# Patient Record
Sex: Female | Born: 1985
Health system: Southern US, Community
[De-identification: ages and names within clinical notes are randomized; demographics above are authoritative.]

## PROBLEM LIST (undated history)

## (undated) DIAGNOSIS — I10 Essential (primary) hypertension: Secondary | ICD-10-CM

## (undated) DIAGNOSIS — J45909 Unspecified asthma, uncomplicated: Secondary | ICD-10-CM

## (undated) HISTORY — PX: ABDOMINAL SURGERY: SHX537

---

## 2006-03-13 ENCOUNTER — Inpatient Hospital Stay (HOSPITAL_COMMUNITY): Admission: AD | Admit: 2006-03-13 | Discharge: 2006-03-13 | Payer: Self-pay | Admitting: Gynecology

## 2006-04-01 ENCOUNTER — Inpatient Hospital Stay (HOSPITAL_COMMUNITY): Admission: AD | Admit: 2006-04-01 | Discharge: 2006-04-01 | Payer: Self-pay | Admitting: Obstetrics and Gynecology

## 2006-05-28 ENCOUNTER — Inpatient Hospital Stay (HOSPITAL_COMMUNITY): Admission: AD | Admit: 2006-05-28 | Discharge: 2006-05-28 | Payer: Self-pay | Admitting: Obstetrics and Gynecology

## 2006-05-31 ENCOUNTER — Inpatient Hospital Stay (HOSPITAL_COMMUNITY): Admission: AD | Admit: 2006-05-31 | Discharge: 2006-06-02 | Payer: Self-pay | Admitting: Obstetrics and Gynecology

## 2006-07-18 ENCOUNTER — Other Ambulatory Visit: Admission: RE | Admit: 2006-07-18 | Discharge: 2006-07-18 | Payer: Self-pay | Admitting: Obstetrics and Gynecology

## 2007-01-02 ENCOUNTER — Ambulatory Visit: Payer: Self-pay | Admitting: Family Medicine

## 2007-01-02 ENCOUNTER — Inpatient Hospital Stay (HOSPITAL_COMMUNITY): Admission: AD | Admit: 2007-01-02 | Discharge: 2007-01-02 | Payer: Self-pay | Admitting: Obstetrics & Gynecology

## 2007-03-07 ENCOUNTER — Inpatient Hospital Stay (HOSPITAL_COMMUNITY): Admission: AD | Admit: 2007-03-07 | Discharge: 2007-03-07 | Payer: Self-pay | Admitting: Gynecology

## 2007-03-07 ENCOUNTER — Ambulatory Visit: Payer: Self-pay | Admitting: *Deleted

## 2007-03-29 ENCOUNTER — Inpatient Hospital Stay (HOSPITAL_COMMUNITY): Admission: AD | Admit: 2007-03-29 | Discharge: 2007-03-29 | Payer: Self-pay | Admitting: Obstetrics and Gynecology

## 2007-03-29 ENCOUNTER — Ambulatory Visit: Payer: Self-pay | Admitting: *Deleted

## 2007-05-06 ENCOUNTER — Ambulatory Visit: Payer: Self-pay | Admitting: *Deleted

## 2007-05-06 ENCOUNTER — Inpatient Hospital Stay (HOSPITAL_COMMUNITY): Admission: AD | Admit: 2007-05-06 | Discharge: 2007-05-08 | Payer: Self-pay | Admitting: Family Medicine

## 2008-12-12 ENCOUNTER — Emergency Department (HOSPITAL_COMMUNITY): Admission: EM | Admit: 2008-12-12 | Discharge: 2008-12-12 | Payer: Self-pay | Admitting: Emergency Medicine

## 2009-05-06 ENCOUNTER — Inpatient Hospital Stay (HOSPITAL_COMMUNITY): Admission: AD | Admit: 2009-05-06 | Discharge: 2009-05-06 | Payer: Self-pay | Admitting: Obstetrics & Gynecology

## 2009-05-13 ENCOUNTER — Ambulatory Visit (HOSPITAL_COMMUNITY): Admission: RE | Admit: 2009-05-13 | Discharge: 2009-05-13 | Payer: Self-pay | Admitting: Obstetrics & Gynecology

## 2010-09-04 ENCOUNTER — Encounter: Payer: Self-pay | Admitting: Obstetrics and Gynecology

## 2010-11-19 LAB — GC/CHLAMYDIA PROBE AMP, GENITAL: GC Probe Amp, Genital: NEGATIVE

## 2010-11-19 LAB — URINALYSIS, ROUTINE W REFLEX MICROSCOPIC
Glucose, UA: NEGATIVE mg/dL
Ketones, ur: >80 mg/dL — AB
Protein, ur: NEGATIVE mg/dL
Urobilinogen, UA: 4 mg/dL — ABNORMAL HIGH (ref 0.0–1.0)

## 2010-11-19 LAB — WET PREP, GENITAL: Trich, Wet Prep: NONE SEEN

## 2010-12-31 NOTE — Discharge Summary (Signed)
Sheri Rodriguez, HEDBERG            ACCOUNT NO.:  [id number]   MEDICAL RECORD NO.:  [id number]          PATIENT TYPE:  INP   LOCATION:  9143                          FACILITY:  [facility]   PHYSICIAN:  James A. Ashley Royalty, M.D.DATE OF BIRTH:  09-Jan-1986   DATE OF ADMISSION:  05/31/2006  DATE OF DISCHARGE:  06/02/2006                               DISCHARGE SUMMARY   DISCHARGE DIAGNOSES:  1. Intrauterine pregnancy at term, delivered.  2. Term birth with living child vertex.   SPECIAL PROCEDURES:  OB delivery.   CONSULTATIONS:  None.   DISCHARGE MEDICATIONS:  Darvocet.   HISTORY AND PHYSICAL:  This is a 25 year old para 1-0-0-1 at [redacted] weeks  gestation who presented in labor.  Digital cervical examination revealed  6 cm dilatation, completely effaced, 0 station.  For the remainder of  the history and physical, please see chart.   HOSPITAL COURSE:  The patient was admitted to [facility]  [place].  Admission laboratory studies were drawn.  She went on to  labor and deliver on May 31, 2006.  The infant was a 6 pound 12  ounce female.  Apgar's 9 at one minute and 9 at five minutes.  Sent to the  newborn nursery.  Delivery was accomplished by Dr. Bing Neighbors. [place].  The patient's postpartum course was benign.  She was discharged on the  second postpartum day, afebrile and in satisfactory condition.   DISPOSITION:  The patient is to return to [facility] in four to six weeks for postpartum evaluation.      James A. Ashley Royalty, M.D.  Electronically Signed     JAM/MEDQ  D:  06/19/2006  T:  06/19/2006  Job:  [id number]

## 2011-05-26 LAB — CBC
HCT: 37.3
MCHC: 33.9
MCV: 88.2
Platelets: 193
WBC: 7.2

## 2011-05-26 LAB — RAPID URINE DRUG SCREEN, HOSP PERFORMED
Benzodiazepines: NOT DETECTED
Cocaine: NOT DETECTED
Tetrahydrocannabinol: NOT DETECTED

## 2011-05-26 LAB — URINALYSIS, DIPSTICK ONLY
Bilirubin Urine: NEGATIVE
Glucose, UA: NEGATIVE
Ketones, ur: 15 — AB
Protein, ur: NEGATIVE
pH: 7

## 2011-05-26 LAB — CHLAMYDIA PROBE AMPLIFICATION, URINE: Chlamydia, Swab/Urine, PCR: NEGATIVE

## 2011-05-30 LAB — CBC
HCT: 34.6 — ABNORMAL LOW
MCV: 89.2
Platelets: 210
RDW: 12.6
WBC: 6.4

## 2011-05-30 LAB — URINALYSIS, ROUTINE W REFLEX MICROSCOPIC
Bilirubin Urine: NEGATIVE
Ketones, ur: NEGATIVE
Ketones, ur: NEGATIVE
Nitrite: NEGATIVE
Nitrite: NEGATIVE
Protein, ur: NEGATIVE
Protein, ur: NEGATIVE
Urobilinogen, UA: 0.2
pH: 7

## 2011-05-30 LAB — WET PREP, GENITAL: Yeast Wet Prep HPF POC: NONE SEEN

## 2011-05-30 LAB — URINE CULTURE: Colony Count: 40000

## 2011-05-30 LAB — SICKLE CELL SCREEN: Sickle Cell Screen: NEGATIVE

## 2011-05-30 LAB — DIFFERENTIAL
Eosinophils Absolute: 0.1
Eosinophils Relative: 2
Lymphs Abs: 2

## 2011-05-30 LAB — HEPATITIS B SURFACE ANTIGEN: Hepatitis B Surface Ag: NEGATIVE

## 2011-05-30 LAB — TYPE AND SCREEN

## 2017-09-01 ENCOUNTER — Emergency Department (HOSPITAL_BASED_OUTPATIENT_CLINIC_OR_DEPARTMENT_OTHER)
Admission: EM | Admit: 2017-09-01 | Discharge: 2017-09-01 | Disposition: A | Discharge status: Home / Self Care | Payer: Medicaid Other | Attending: Emergency Medicine | Admitting: Emergency Medicine

## 2017-09-01 ENCOUNTER — Encounter (HOSPITAL_BASED_OUTPATIENT_CLINIC_OR_DEPARTMENT_OTHER): Payer: Self-pay | Admitting: Emergency Medicine

## 2017-09-01 ENCOUNTER — Other Ambulatory Visit: Payer: Self-pay

## 2017-09-01 DIAGNOSIS — I10 Essential (primary) hypertension: Secondary | ICD-10-CM | POA: Insufficient documentation

## 2017-09-01 DIAGNOSIS — J020 Streptococcal pharyngitis: Secondary | ICD-10-CM | POA: Diagnosis not present

## 2017-09-01 DIAGNOSIS — R070 Pain in throat: Secondary | ICD-10-CM | POA: Diagnosis present

## 2017-09-01 DIAGNOSIS — Z23 Encounter for immunization: Secondary | ICD-10-CM | POA: Diagnosis not present

## 2017-09-01 HISTORY — DX: Essential (primary) hypertension: I10

## 2017-09-01 LAB — RAPID STREP SCREEN (MED CTR MEBANE ONLY): STREPTOCOCCUS, GROUP A SCREEN (DIRECT): POSITIVE — AB

## 2017-09-01 MED ORDER — ACETAMINOPHEN 500 MG PO TABS
1000.0000 mg | ORAL_TABLET | Freq: Once | ORAL | Status: AC
  Administered 2017-09-01: 1000 mg via ORAL
  Filled 2017-09-01: qty 2

## 2017-09-01 MED ORDER — PENICILLIN G BENZATHINE 1200000 UNIT/2ML IM SUSP
1.2000 10*6.[IU] | Freq: Once | INTRAMUSCULAR | Status: AC
  Administered 2017-09-01: 1.2 10*6.[IU] via INTRAMUSCULAR
  Filled 2017-09-01: qty 2

## 2017-09-01 NOTE — ED Triage Notes (Addendum)
Pt c/o sinus pain, congestion, sore throat and body aches that started yesterday. Pt is 5 months pregnant

## 2017-09-01 NOTE — ED Provider Notes (Signed)
[facility] EMERGENCY DEPARTMENT Provider Note   CSN: [id number] Arrival date & time: 09/01/17  [id number]     History   Chief Complaint Chief Complaint  Patient presents with  . URI    HPI Sheri Rodriguez is a 32 y.o. female.  The history is provided by the patient.  Sore Throat  This is a new problem. The current episode started 2 days ago. The problem occurs constantly. The problem has not changed since onset.Pertinent negatives include no chest pain, no abdominal pain, no headaches and no shortness of breath. Nothing aggravates the symptoms. Nothing relieves the symptoms. She has tried nothing for the symptoms. The treatment provided no relief.    Past Medical History:  Diagnosis Date  . Hypertension     There are no active problems to display for this patient.   History reviewed. No pertinent surgical history.  OB History    Gravida Para Term Preterm AB Living   1             SAB TAB Ectopic Multiple Live Births                   Home Medications    Prior to Admission medications   Not on File    Family History No family history on file.  Social History Social History   Tobacco Use  . Smoking status: Never Smoker  . Smokeless tobacco: Never Used  Substance Use Topics  . Alcohol use: No    Frequency: Never  . Drug use: No     Allergies   Aspirin   Review of Systems Review of Systems  Constitutional: Positive for fever.  HENT: Positive for rhinorrhea and sore throat. Negative for drooling, trouble swallowing and voice change.   Respiratory: Negative for shortness of breath.   Cardiovascular: Negative for chest pain.  Gastrointestinal: Negative for abdominal pain.  Genitourinary: Negative for dysuria.  Neurological: Negative for headaches.  All other systems reviewed and are negative.    Physical Exam Updated Vital Signs BP (!) 146/85 (BP Location: Left Arm)   Pulse (!) 104   Temp 100 F (37.8 C) (Oral)   Resp 18   Ht  5\' 7"  (1.702 m)   Wt 134.7 kg (297 lb)   LMP  (Exact Date)   SpO2 100%   BMI 46.52 kg/m   Physical Exam  Constitutional: She is oriented to person, place, and time. She appears well-developed and well-nourished. No distress.  HENT:  Head: Normocephalic and atraumatic.  Mouth/Throat: Oropharynx is clear and moist. No oropharyngeal exudate.  Intact phonation  Eyes: Conjunctivae are normal. Pupils are equal, round, and reactive to light.  Neck: Normal range of motion. Neck supple.  Cardiovascular: Normal rate, regular rhythm, normal heart sounds and intact distal pulses.  Pulmonary/Chest: Effort normal and breath sounds normal. No stridor. She has no wheezes. She has no rales.  Abdominal: Soft. Bowel sounds are normal. She exhibits no mass. There is no tenderness. There is no guarding.  gravid  Musculoskeletal: Normal range of motion.  Neurological: She is alert and oriented to person, place, and time.  Skin: Skin is warm and dry. Capillary refill takes less than 2 seconds.  Psychiatric: She has a normal mood and affect.     ED Treatments / Results  Labs (all labs ordered are listed, but only abnormal results are displayed) Labs Reviewed  RAPID STREP SCREEN (NOT AT [facility]) - Abnormal; Notable for the following components:  Result Value   Streptococcus, Group A Screen (Direct) POSITIVE (*)    All other components within normal limits    Radiology No results found.  Procedures Procedures (including critical care time)  Medications Ordered in ED Medications  acetaminophen (TYLENOL) tablet 1,000 mg (1,000 mg Oral Given 09/01/17 0350)  penicillin g benzathine (BICILLIN LA) 1200000 UNIT/2ML injection 1.2 Million Units (1.2 Million Units Intramuscular Given 09/01/17 0405)     Final Clinical Impressions(s) / ED Diagnoses   call you OB to discuss ongoing care  Return for worsening pain, vomiting blood inability to pass urine,  fevers > 100.4 unrelieved by medication, shortness  of breath, intractable vomiting, or diarrhea, abdominal pain, Inability to tolerate liquids or food, cough, altered mental status or any concerns. No signs of systemic illness or infection. The patient is nontoxic-appearing on exam and vital signs are within normal limits.    I have reviewed the triage vital signs and the nursing notes. Pertinent labs &imaging results that were available during my care of the patient were reviewed by me and considered in my medical decision making (see chart for details).  After history, exam, and medical workup I feel the patient has been appropriately medically screened and is safe for discharge home. Pertinent diagnoses were discussed with the patient. Patient was given return precautions.     Keena Dinse, MD 09/01/17 [phone]

## 2018-01-05 ENCOUNTER — Emergency Department (HOSPITAL_BASED_OUTPATIENT_CLINIC_OR_DEPARTMENT_OTHER)
Admission: EM | Admit: 2018-01-05 | Discharge: 2018-01-05 | Disposition: A | Discharge status: Home / Self Care | Payer: Medicaid Other | Attending: Emergency Medicine | Admitting: Emergency Medicine

## 2018-01-05 ENCOUNTER — Other Ambulatory Visit: Payer: Self-pay | Admitting: Emergency Medicine

## 2018-01-05 ENCOUNTER — Encounter (HOSPITAL_BASED_OUTPATIENT_CLINIC_OR_DEPARTMENT_OTHER): Payer: Self-pay

## 2018-01-05 ENCOUNTER — Other Ambulatory Visit: Payer: Self-pay

## 2018-01-05 DIAGNOSIS — J45909 Unspecified asthma, uncomplicated: Secondary | ICD-10-CM | POA: Diagnosis not present

## 2018-01-05 DIAGNOSIS — I10 Essential (primary) hypertension: Secondary | ICD-10-CM | POA: Diagnosis not present

## 2018-01-05 DIAGNOSIS — G971 Other reaction to spinal and lumbar puncture: Secondary | ICD-10-CM | POA: Diagnosis not present

## 2018-01-05 DIAGNOSIS — R51 Headache: Secondary | ICD-10-CM | POA: Diagnosis present

## 2018-01-05 HISTORY — DX: Unspecified asthma, uncomplicated: J45.909

## 2018-01-05 LAB — URINALYSIS, ROUTINE W REFLEX MICROSCOPIC
BILIRUBIN URINE: NEGATIVE
Glucose, UA: NEGATIVE mg/dL
Ketones, ur: NEGATIVE mg/dL
Leukocytes, UA: NEGATIVE
Nitrite: NEGATIVE
PROTEIN: NEGATIVE mg/dL
Specific Gravity, Urine: 1.01 (ref 1.005–1.030)
pH: 7.5 (ref 5.0–8.0)

## 2018-01-05 LAB — COMPREHENSIVE METABOLIC PANEL
ALK PHOS: 88 U/L (ref 38–126)
ALT: 20 U/L (ref 14–54)
AST: 19 U/L (ref 15–41)
Albumin: 3 g/dL — ABNORMAL LOW (ref 3.5–5.0)
Anion gap: 10 (ref 5–15)
BILIRUBIN TOTAL: 0.4 mg/dL (ref 0.3–1.2)
BUN: 7 mg/dL (ref 6–20)
CO2: 25 mmol/L (ref 22–32)
Calcium: 8.4 mg/dL — ABNORMAL LOW (ref 8.9–10.3)
Chloride: 102 mmol/L (ref 101–111)
Creatinine, Ser: 0.69 mg/dL (ref 0.44–1.00)
GLUCOSE: 99 mg/dL (ref 65–99)
Potassium: 3.4 mmol/L — ABNORMAL LOW (ref 3.5–5.1)
Sodium: 137 mmol/L (ref 135–145)
TOTAL PROTEIN: 6.5 g/dL (ref 6.5–8.1)

## 2018-01-05 LAB — CBC WITH DIFFERENTIAL/PLATELET
BASOS ABS: 0 10*3/uL (ref 0.0–0.1)
Basophils Relative: 0 %
Eosinophils Absolute: 0.4 10*3/uL (ref 0.0–0.7)
Eosinophils Relative: 5 %
HEMATOCRIT: 35.9 % — AB (ref 36.0–46.0)
Hemoglobin: 12.2 g/dL (ref 12.0–15.0)
LYMPHS ABS: 2 10*3/uL (ref 0.7–4.0)
LYMPHS PCT: 27 %
MCH: 28.3 pg (ref 26.0–34.0)
MCHC: 34 g/dL (ref 30.0–36.0)
MCV: 83.3 fL (ref 78.0–100.0)
MONO ABS: 0.5 10*3/uL (ref 0.1–1.0)
MONOS PCT: 7 %
NEUTROS ABS: 4.7 10*3/uL (ref 1.7–7.7)
Neutrophils Relative %: 61 %
Platelets: 277 10*3/uL (ref 150–400)
RBC: 4.31 MIL/uL (ref 3.87–5.11)
RDW: 13.2 % (ref 11.5–15.5)
WBC: 7.7 10*3/uL (ref 4.0–10.5)

## 2018-01-05 MED ORDER — PREDNISONE 20 MG PO TABS
20.00 | ORAL_TABLET | ORAL | Status: DC
Start: 2018-01-04 — End: 2018-01-05

## 2018-01-05 MED ORDER — KETOROLAC TROMETHAMINE 30 MG/ML IJ SOLN
30.0000 mg | Freq: Once | INTRAMUSCULAR | Status: AC
  Administered 2018-01-05: 30 mg via INTRAVENOUS
  Filled 2018-01-05: qty 1

## 2018-01-05 MED ORDER — GENERIC EXTERNAL MEDICATION
300.00 | Status: DC
Start: 2018-01-03 — End: 2018-01-05

## 2018-01-05 MED ORDER — GENERIC EXTERNAL MEDICATION
Status: DC
Start: ? — End: 2018-01-05

## 2018-01-05 MED ORDER — TETANUS-DIPHTH-ACELL PERTUSSIS 5-2.5-18.5 LF-MCG/0.5 IM SUSP
0.50 | INTRAMUSCULAR | Status: DC
Start: ? — End: 2018-01-05

## 2018-01-05 MED ORDER — BUTORPHANOL TARTRATE 1 MG/ML IJ SOLN
1.00 | INTRAMUSCULAR | Status: DC
Start: ? — End: 2018-01-05

## 2018-01-05 MED ORDER — SODIUM CHLORIDE 0.9 % IJ SOLN
5.00 | INTRAMUSCULAR | Status: DC
Start: ? — End: 2018-01-05

## 2018-01-05 MED ORDER — BISACODYL 10 MG RE SUPP
10.00 | RECTAL | Status: DC
Start: ? — End: 2018-01-05

## 2018-01-05 MED ORDER — MAGNESIUM HYDROXIDE 400 MG/5ML PO SUSP
30.00 | ORAL | Status: DC
Start: ? — End: 2018-01-05

## 2018-01-05 MED ORDER — SODIUM CHLORIDE 0.9 % IV BOLUS
1000.0000 mL | Freq: Once | INTRAVENOUS | Status: AC
  Administered 2018-01-05: 1000 mL via INTRAVENOUS

## 2018-01-05 MED ORDER — BENZOCAINE-MENTHOL 20-0.5 % EX AERO
1.00 | INHALATION_SPRAY | CUTANEOUS | Status: DC
Start: ? — End: 2018-01-05

## 2018-01-05 MED ORDER — GENERIC EXTERNAL MEDICATION
1.00 | Status: DC
Start: ? — End: 2018-01-05

## 2018-01-05 MED ORDER — GENERIC EXTERNAL MEDICATION
0.50 | Status: DC
Start: ? — End: 2018-01-05

## 2018-01-05 MED ORDER — ZOLPIDEM TARTRATE 5 MG PO TABS
10.00 | ORAL_TABLET | ORAL | Status: DC
Start: ? — End: 2018-01-05

## 2018-01-05 MED ORDER — MEASLES, MUMPS & RUBELLA VAC ~~LOC~~ INJ
0.50 | INJECTION | SUBCUTANEOUS | Status: DC
Start: ? — End: 2018-01-05

## 2018-01-05 MED ORDER — FLUTICASONE FUROATE-VILANTEROL 100-25 MCG/INH IN AEPB
1.00 | INHALATION_SPRAY | RESPIRATORY_TRACT | Status: DC
Start: 2018-01-04 — End: 2018-01-05

## 2018-01-05 MED ORDER — NALOXONE HCL 0.4 MG/ML IJ SOLN
0.10 | INTRAMUSCULAR | Status: DC
Start: ? — End: 2018-01-05

## 2018-01-05 MED ORDER — ALUMINUM-MAGNESIUM-SIMETHICONE 200-200-20 MG/5ML PO SUSP
30.00 | ORAL | Status: DC
Start: ? — End: 2018-01-05

## 2018-01-05 MED ORDER — ACETAMINOPHEN 325 MG PO TABS
650.00 | ORAL_TABLET | ORAL | Status: DC
Start: ? — End: 2018-01-05

## 2018-01-05 MED ORDER — METOCLOPRAMIDE HCL 5 MG/ML IJ SOLN
10.0000 mg | Freq: Once | INTRAMUSCULAR | Status: AC
  Administered 2018-01-05: 10 mg via INTRAVENOUS
  Filled 2018-01-05: qty 2

## 2018-01-05 MED ORDER — LACTATED RINGERS IV SOLN
INTRAVENOUS | Status: DC
Start: ? — End: 2018-01-05

## 2018-01-05 MED ORDER — ALBUTEROL SULFATE (2.5 MG/3ML) 0.083% IN NEBU
2.50 | INHALATION_SOLUTION | RESPIRATORY_TRACT | Status: DC
Start: ? — End: 2018-01-05

## 2018-01-05 MED ORDER — BUTORPHANOL TARTRATE 2 MG/ML IJ SOLN
2.00 | INTRAMUSCULAR | Status: DC
Start: ? — End: 2018-01-05

## 2018-01-05 MED ORDER — CYCLOBENZAPRINE HCL 10 MG PO TABS
10.00 | ORAL_TABLET | ORAL | Status: DC
Start: ? — End: 2018-01-05

## 2018-01-05 MED ORDER — EPHEDRINE SULFATE 50 MG/ML IJ SOLN
5.00 | INTRAMUSCULAR | Status: DC
Start: ? — End: 2018-01-05

## 2018-01-05 MED ORDER — HYDROCODONE-ACETAMINOPHEN 5-325 MG PO TABS
1.00 | ORAL_TABLET | ORAL | Status: DC
Start: ? — End: 2018-01-05

## 2018-01-05 MED ORDER — NALBUPHINE HCL 10 MG/ML IJ SOLN
2.50 | INTRAMUSCULAR | Status: DC
Start: ? — End: 2018-01-05

## 2018-01-05 MED ORDER — IRON PO
1.00 | ORAL | Status: DC
Start: 2018-01-03 — End: 2018-01-05

## 2018-01-05 NOTE — ED Notes (Signed)
ED Provider at bedside. 

## 2018-01-05 NOTE — ED Notes (Signed)
Provided Coke to pt for caffeine per v.o. EDP (Law).

## 2018-01-05 NOTE — ED Provider Notes (Signed)
[facility] EMERGENCY DEPARTMENT Provider Note   CSN: [id number] Arrival date & time: 01/05/18  1525     History   Chief Complaint Chief Complaint  Patient presents with  . Headache    HPI Sheri Rodriguez is a 32 y.o. female with history of hypertension and asthma who presents 2 days postpartum with headache, dizziness, tinnitus, neck and upper back pain after epidural analgesia 2 days ago.  Patient has been taking ibuprofen at home without relief.  Her headache is worse with sitting up and standing and moving her neck.  She has associated nausea and photophobia.  She denies any fevers.  She denies any chest pain, shortness of breath, vomiting, abnormal abdominal pain.  She continues to have some vaginal bleeding.  She denies any abnormal symptoms related to her delivery.  She reports when the epidural was being administered, she felt the needle move and it hurt.  She has had the symptoms since the epidural.  Her OB/GYN gave her a muscle relaxer which has helped her upper back and neck pain, but continues to have headache.  Her headache is described as throbbing and constant.  HPI  Past Medical History:  Diagnosis Date  . Asthma   . Hypertension     There are no active problems to display for this patient.   History reviewed. No pertinent surgical history.   OB History    Gravida  1   Para      Term      Preterm      AB      Living        SAB      TAB      Ectopic      Multiple      Live Births               Home Medications    Prior to Admission medications   Not on File    Family History No family history on file.  Social History Social History   Tobacco Use  . Smoking status: Never Smoker  . Smokeless tobacco: Never Used  Substance Use Topics  . Alcohol use: No    Frequency: Never  . Drug use: No     Allergies   Aspirin   Review of Systems Review of Systems  Constitutional: Negative for chills and fever.  HENT:  Positive for tinnitus. Negative for facial swelling and sore throat.   Eyes: Positive for photophobia. Negative for visual disturbance.  Respiratory: Negative for shortness of breath.   Cardiovascular: Negative for chest pain.  Gastrointestinal: Positive for abdominal pain and nausea. Negative for vomiting. Abdominal distention: intermittent contracting of lower abdomen.  Genitourinary: Positive for vaginal bleeding. Negative for dysuria.  Musculoskeletal: Negative for back pain.  Skin: Negative for rash and wound.  Neurological: Positive for dizziness and headaches.  Psychiatric/Behavioral: The patient is not nervous/anxious.      Physical Exam Updated Vital Signs BP (!) 144/85   Pulse 76   Temp 98.3 F (36.8 C) (Oral)   Resp 20   Ht  (1.702 m)   Wt 128.7 kg (283 lb 11.2 oz)   SpO2 97%   Breastfeeding? Unknown   BMI 44.43 kg/m   Physical Exam  Constitutional: She appears well-developed and well-nourished. No distress.  HENT:  Head: Normocephalic and atraumatic.  Mouth/Throat: Oropharynx is clear and moist. No oropharyngeal exudate.  Eyes: Pupils are equal, round, and reactive to light. Conjunctivae and EOM are  normal. Right eye exhibits no discharge. Left eye exhibits no discharge. No scleral icterus.  Neck: Normal range of motion. Neck supple. No thyromegaly present.  Midline cervical and thoracic tenderness, and bilateral paraspinal muscles  Cardiovascular: Normal rate, regular rhythm, normal heart sounds and intact distal pulses. Exam reveals no gallop and no friction rub.  No murmur heard. Pulmonary/Chest: Effort normal and breath sounds normal. No stridor. No respiratory distress. She has no wheezes. She has no rales.  Abdominal: Soft. Bowel sounds are normal. She exhibits no distension. There is no tenderness. There is no rebound and no guarding.  Musculoskeletal: She exhibits no edema.  Small healing puncture wound to the lumbar spine, no tenderness over this  area, no swelling or color change  Lymphadenopathy:    She has no cervical adenopathy.  Neurological: She is alert. She displays a negative Romberg sign. Coordination normal. GCS eye subscore is 4. GCS verbal subscore is 5. GCS motor subscore is 6.  CN 3-12 intact; normal sensation throughout; 5/5 strength in all 4 extremities; equal bilateral grip strength  Skin: Skin is warm and dry. No rash noted. She is not diaphoretic. No pallor.  Psychiatric: She has a normal mood and affect.  Nursing note and vitals reviewed.    ED Treatments / Results  Labs (all labs ordered are listed, but only abnormal results are displayed) Labs Reviewed  URINALYSIS, ROUTINE W REFLEX MICROSCOPIC - Abnormal; Notable for the following components:      Result Value   Hgb urine dipstick MODERATE (*)    All other components within normal limits  COMPREHENSIVE METABOLIC PANEL - Abnormal; Notable for the following components:   Potassium 3.4 (*)    Calcium 8.4 (*)    Albumin 3.0 (*)    All other components within normal limits  CBC WITH DIFFERENTIAL/PLATELET - Abnormal; Notable for the following components:   HCT 35.9 (*)    All other components within normal limits    EKG None  Radiology No results found.  Procedures Procedures (including critical care time)  Medications Ordered in ED Medications  sodium chloride 0.9 % bolus 1,000 mL (0 mLs Intravenous Stopped 01/05/18 1836)  ketorolac (TORADOL) 30 MG/ML injection 30 mg (30 mg Intravenous Given 01/05/18 1727)  metoCLOPramide (REGLAN) injection 10 mg (10 mg Intravenous Given 01/05/18 1727)     Initial Impression / Assessment and Plan / ED Course  I have reviewed the triage vital signs and the nursing notes.  Pertinent labs & imaging results that were available during my care of the patient were reviewed by me and considered in my medical decision making (see chart for details).     Patient with post dural puncture headache.  Labs unremarkable.   Patient with normal neuro exam without focal deficits.  Patient is feeling much better with headache cocktail and caffeine in the ED.  I consulted neurologist, Dr. Laurence Slate, who advised caffeine as the conservative treatment and referral to anesthesiology if unresolved for blood patch.  I consulted anesthesiology who advised patient to follow-up at [facility] maternity ward (where patient delivered) for blood patch if symptoms return.  However, peak of headache is usually 48 to 72 hours.  I made patient aware of this.  She is feeling much better at discharge.  Patient advised to drink caffeinated beverages. Patient understands and agrees with plan.  Patient vitals stable throughout ED course and discharged in satisfactory condition. I discussed patient case with Dr. Jeraldine Loots who guided the patient's management and  agrees with plan.   Final Clinical Impressions(s) / ED Diagnoses   Final diagnoses:  Postdural puncture headache    ED Discharge Orders    None       Emi Holes, PA-C 01/06/18 0015    Gerhard Munch, MD 01/06/18 0025

## 2018-01-05 NOTE — ED Triage Notes (Addendum)
C/o HA since having epidural/vaginal delivery on 5/21-dizziness today and ringing in ears yesterday-NAD-steady gait

## 2018-01-05 NOTE — Discharge Instructions (Addendum)
Try to drink caffeinated beverages and make sure your are staying well-hydrated.  Please go to the [facility] labor and delivery if you develop any new or worsening symptoms for blood patch.  You may ultimately need to go the emergency department.

## 2018-07-25 ENCOUNTER — Encounter (HOSPITAL_BASED_OUTPATIENT_CLINIC_OR_DEPARTMENT_OTHER): Payer: Self-pay

## 2018-07-25 ENCOUNTER — Other Ambulatory Visit: Payer: Self-pay

## 2018-07-25 ENCOUNTER — Emergency Department (HOSPITAL_BASED_OUTPATIENT_CLINIC_OR_DEPARTMENT_OTHER)
Admission: EM | Admit: 2018-07-25 | Discharge: 2018-07-25 | Disposition: A | Discharge status: Home / Self Care | Payer: Medicaid Other | Attending: Emergency Medicine | Admitting: Emergency Medicine

## 2018-07-25 ENCOUNTER — Emergency Department (HOSPITAL_BASED_OUTPATIENT_CLINIC_OR_DEPARTMENT_OTHER): Payer: Medicaid Other

## 2018-07-25 DIAGNOSIS — J45909 Unspecified asthma, uncomplicated: Secondary | ICD-10-CM | POA: Insufficient documentation

## 2018-07-25 DIAGNOSIS — R05 Cough: Secondary | ICD-10-CM

## 2018-07-25 DIAGNOSIS — J209 Acute bronchitis, unspecified: Secondary | ICD-10-CM

## 2018-07-25 DIAGNOSIS — F17210 Nicotine dependence, cigarettes, uncomplicated: Secondary | ICD-10-CM | POA: Insufficient documentation

## 2018-07-25 DIAGNOSIS — R059 Cough, unspecified: Secondary | ICD-10-CM

## 2018-07-25 DIAGNOSIS — I10 Essential (primary) hypertension: Secondary | ICD-10-CM | POA: Insufficient documentation

## 2018-07-25 MED ORDER — PREDNISONE 10 MG PO TABS
60.0000 mg | ORAL_TABLET | Freq: Once | ORAL | Status: AC
  Administered 2018-07-25: 60 mg via ORAL
  Filled 2018-07-25: qty 1

## 2018-07-25 MED ORDER — IPRATROPIUM-ALBUTEROL 0.5-2.5 (3) MG/3ML IN SOLN
3.0000 mL | Freq: Once | RESPIRATORY_TRACT | Status: AC
Start: 2018-07-25 — End: 2018-07-25
  Administered 2018-07-25: 3 mL via RESPIRATORY_TRACT
  Filled 2018-07-25: qty 3

## 2018-07-25 MED ORDER — PREDNISONE 20 MG PO TABS: 40.0000 mg | ORAL_TABLET | Freq: Every day | ORAL | 0 refills | Status: DC

## 2018-07-25 MED ORDER — ALBUTEROL SULFATE HFA 108 (90 BASE) MCG/ACT IN AERS: 1.0000 | INHALATION_SPRAY | Freq: Four times a day (QID) | RESPIRATORY_TRACT | 0 refills | Status: DC | PRN

## 2018-07-25 MED FILL — PROVENTIL HFA 108 (90 BASE): 108 (90 BAS | 25 days supply | Qty: 7 | Fill #0

## 2018-07-25 MED FILL — predniSONE 20 MG TABS: 20 | 10 days supply | Qty: 10 | Fill #0

## 2018-07-25 NOTE — Discharge Instructions (Signed)
It was my pleasure taking care of you today!   Take prednisone daily starting tomorrow morning. You had your first dose in the ER today.  I have given you a refill of your home inhaler.   Follow up with your primary care provider for further discussion of today's ER visit.   If you develop any new or worsening symptoms, including worsening shortness of breath or other symptoms that concern you, please return to the Emergency Department immediately.

## 2018-07-25 NOTE — ED Provider Notes (Signed)
[facility] EMERGENCY DEPARTMENT Provider Note   CSN: [id number] Arrival date & time: 07/25/18  1235     History   Chief Complaint Chief Complaint  Patient presents with  . Cough    HPI Sheri Rodriguez is a 32 y.o. female.  The history is provided by the patient and medical records. No language interpreter was used.  Cough  Associated symptoms include ear pain. Pertinent negatives include no sore throat, no myalgias and no shortness of breath.   Sheri Rodriguez is a 32 y.o. female  with a PMH of asthma, HTN who presents to the Emergency Department complaining of cough, congestion and bilateral ear pain for the last 3 days.  Feels as if congestion is stuck in her chest and she cannot cough it up.  Associated with subjective fevers which she has taken OTC antipyretics for with adequate relief.  She also has been using her child's albuterol inhaler and nebulizer with little improvement in her symptoms.  She has a 4-month-old at home who was just diagnosed with pneumonia a few days before her symptoms began.  She is not currently breast-feeding.  Denies any chest pain or shortness of breath.  No abdominal pain, nausea or vomiting.    Past Medical History:  Diagnosis Date  . Asthma   . Hypertension     There are no active problems to display for this patient.   History reviewed. No pertinent surgical history.   OB History    Gravida  1   Para      Term      Preterm      AB      Living        SAB      TAB      Ectopic      Multiple      Live Births               Home Medications    Prior to Admission medications   Medication Sig Start Date End Date Taking? Authorizing Provider  albuterol (PROVENTIL HFA;VENTOLIN HFA) 108 (90 Base) MCG/ACT inhaler Inhale 1-2 puffs into the lungs every 6 (six) hours as needed for wheezing or shortness of breath. 07/25/18   Ward, Chase Picket, PA-C  predniSONE (DELTASONE) 20 MG tablet Take 2 tablets (40  mg total) by mouth daily. 07/25/18   Ward, Chase Picket, PA-C    Family History No family history on file.  Social History Social History   Tobacco Use  . Smoking status: Current Every Day Smoker  . Smokeless tobacco: Never Used  Substance Use Topics  . Alcohol use: No    Frequency: Never  . Drug use: No     Allergies   Aspirin   Review of Systems Review of Systems  Constitutional: Positive for fever (Subjective).  HENT: Positive for congestion and ear pain. Negative for sore throat.   Respiratory: Positive for cough. Negative for shortness of breath.   Gastrointestinal: Negative for abdominal pain, diarrhea, nausea and vomiting.  Genitourinary: Negative for dysuria.  Musculoskeletal: Negative for myalgias.  Skin: Negative for rash.  Neurological: Negative for weakness.     Physical Exam Updated Vital Signs BP 139/90 (BP Location: Left Arm)   Pulse (!) 102   Temp (!) 97.5 F (36.4 C) (Oral)   Resp 20   Ht 5\' 7"  (1.702 m)   Wt (!) 139.7 kg   SpO2 100%   BMI 48.24 kg/m   Physical Exam  Constitutional: She is oriented to person, place, and time. She appears well-developed and well-nourished. No distress.  HENT:  Head: Normocephalic and atraumatic.  + PND  No tonsillar hypertrophy or exudates. + Nasal congestion  Cardiovascular: Normal heart sounds.  No murmur heard. Regular rate and rhythm on exam.  Pulmonary/Chest: Effort normal. No respiratory distress.  Faint expiratory wheezing bilaterally.  Abdominal: Soft. She exhibits no distension. There is no tenderness.  Neurological: She is alert and oriented to person, place, and time.  Skin: Skin is warm and dry.  Nursing note and vitals reviewed.    ED Treatments / Results  Labs (all labs ordered are listed, but only abnormal results are displayed) Labs Reviewed - No data to display  EKG None  Radiology Dg Chest 2 View  Result Date: 07/25/2018 CLINICAL DATA:  Cough, shortness of breath, chest  pain, and fever for the past 24 hours. History of asthma. Current smoker. EXAM: CHEST - 2 VIEW COMPARISON:  Portable chest x-ray of Jan 02, 2018 FINDINGS: The lungs are mildly hyperinflated and clear. The heart and pulmonary vascularity are normal. The mediastinum is normal in width. There is no pleural effusion. The bony thorax exhibits no acute abnormality. IMPRESSION: Mild hyperinflation may be voluntary or may reflect air trapping secondary to reactive airway disease or acute bronchitis. No acute pneumonia. Electronically Signed   By: David  SwazilandJordan M.D.   On: 07/25/2018 15:05    Procedures Procedures (including critical care time)  Medications Ordered in ED Medications  ipratropium-albuterol (DUONEB) 0.5-2.5 (3) MG/3ML nebulizer solution 3 mL (has no administration in time range)  predniSONE (DELTASONE) tablet 60 mg (has no administration in time range)     Initial Impression / Assessment and Plan / ED Course  I have reviewed the triage vital signs and the nursing notes.  Pertinent labs & imaging results that were available during my care of the patient were reviewed by me and considered in my medical decision making (see chart for details).    Sheri Rodriguez is a 32 y.o. female who presents to ED for cough, congestion for the last 3 days.  On exam, patient is afebrile, hemodynamically stable with wheezing to bilateral lung fields.  Oxygenation saturation of 100%.  Appears in no discomfort.  Given neb treatment and steroids.  CXR with no PNA or other acute findings. Will treat with steroid burst and albuterol PRN. PCP Follow up if symptoms persist. Reasons to return to ER discussed and all questions answered.   Final Clinical Impressions(s) / ED Diagnoses   Final diagnoses:  Cough  Acute bronchitis, unspecified organism    ED Discharge Orders         Ordered    albuterol (PROVENTIL HFA;VENTOLIN HFA) 108 (90 Base) MCG/ACT inhaler  Every 6 hours PRN     07/25/18 1509     predniSONE (DELTASONE) 20 MG tablet  Daily     07/25/18 1509           Ward, Chase PicketJaime Pilcher, PA-C 07/25/18 1516    Little, Ambrose Finlandachel Morgan, MD 07/26/18 1320

## 2018-07-25 NOTE — ED Triage Notes (Addendum)
C/o flu like sx x 3 days-child at home + PNA-NAD-steady gait

## 2018-12-06 ENCOUNTER — Emergency Department (HOSPITAL_BASED_OUTPATIENT_CLINIC_OR_DEPARTMENT_OTHER): Payer: Self-pay

## 2018-12-06 ENCOUNTER — Other Ambulatory Visit: Payer: Self-pay

## 2018-12-06 ENCOUNTER — Encounter (HOSPITAL_BASED_OUTPATIENT_CLINIC_OR_DEPARTMENT_OTHER): Payer: Self-pay | Admitting: Emergency Medicine

## 2018-12-06 ENCOUNTER — Emergency Department (HOSPITAL_BASED_OUTPATIENT_CLINIC_OR_DEPARTMENT_OTHER)
Admission: EM | Admit: 2018-12-06 | Discharge: 2018-12-06 | Disposition: A | Discharge status: Home / Self Care | Payer: Self-pay | Attending: Emergency Medicine | Admitting: Emergency Medicine

## 2018-12-06 DIAGNOSIS — F17210 Nicotine dependence, cigarettes, uncomplicated: Secondary | ICD-10-CM | POA: Insufficient documentation

## 2018-12-06 DIAGNOSIS — R1084 Generalized abdominal pain: Secondary | ICD-10-CM | POA: Insufficient documentation

## 2018-12-06 DIAGNOSIS — J45909 Unspecified asthma, uncomplicated: Secondary | ICD-10-CM | POA: Insufficient documentation

## 2018-12-06 DIAGNOSIS — E860 Dehydration: Secondary | ICD-10-CM | POA: Insufficient documentation

## 2018-12-06 DIAGNOSIS — R197 Diarrhea, unspecified: Secondary | ICD-10-CM | POA: Insufficient documentation

## 2018-12-06 DIAGNOSIS — I10 Essential (primary) hypertension: Secondary | ICD-10-CM | POA: Insufficient documentation

## 2018-12-06 DIAGNOSIS — R112 Nausea with vomiting, unspecified: Secondary | ICD-10-CM | POA: Insufficient documentation

## 2018-12-06 LAB — URINALYSIS, ROUTINE W REFLEX MICROSCOPIC
Bilirubin Urine: NEGATIVE
Glucose, UA: NEGATIVE mg/dL
Ketones, ur: NEGATIVE mg/dL
Leukocytes,Ua: NEGATIVE
Nitrite: NEGATIVE
Protein, ur: 100 mg/dL — AB
Specific Gravity, Urine: >1.03 — ABNORMAL HIGH (ref 1.005–1.030)
pH: 6 (ref 5.0–8.0)

## 2018-12-06 LAB — COMPREHENSIVE METABOLIC PANEL
ALT: 19 U/L (ref 0–44)
AST: 25 U/L (ref 15–41)
Albumin: 3.6 g/dL (ref 3.5–5.0)
Alkaline Phosphatase: 62 U/L (ref 38–126)
Anion gap: 7 (ref 5–15)
BUN: 5 mg/dL — ABNORMAL LOW (ref 6–20)
CO2: 26 mmol/L (ref 22–32)
Calcium: 8.2 mg/dL — ABNORMAL LOW (ref 8.9–10.3)
Chloride: 104 mmol/L (ref 98–111)
Creatinine, Ser: 0.76 mg/dL (ref 0.44–1.00)
GFR calc Af Amer: >60 mL/min (ref >60)
GFR calc non Af Amer: >60 mL/min (ref >60)
Glucose, Bld: 102 mg/dL — ABNORMAL HIGH (ref 70–99)
Potassium: 3.6 mmol/L (ref 3.5–5.1)
Sodium: 137 mmol/L (ref 135–145)
Total Bilirubin: 0.3 mg/dL (ref 0.3–1.2)
Total Protein: 7.1 g/dL (ref 6.5–8.1)

## 2018-12-06 LAB — CBC WITH DIFFERENTIAL/PLATELET
Abs Immature Granulocytes: 0.01 10*3/uL (ref 0.00–0.07)
Basophils Absolute: 0 10*3/uL (ref 0.0–0.1)
Basophils Relative: 0 %
Eosinophils Absolute: 0.1 10*3/uL (ref 0.0–0.5)
Eosinophils Relative: 1 %
HCT: 42.3 % (ref 36.0–46.0)
Hemoglobin: 13.4 g/dL (ref 12.0–15.0)
Immature Granulocytes: 0 %
Lymphocytes Relative: 32 %
Lymphs Abs: 1.5 10*3/uL (ref 0.7–4.0)
MCH: 27 pg (ref 26.0–34.0)
MCHC: 31.7 g/dL (ref 30.0–36.0)
MCV: 85.1 fL (ref 80.0–100.0)
Monocytes Absolute: 0.4 10*3/uL (ref 0.1–1.0)
Monocytes Relative: 9 %
Neutro Abs: 2.7 10*3/uL (ref 1.7–7.7)
Neutrophils Relative %: 58 %
Platelets: 247 10*3/uL (ref 150–400)
RBC: 4.97 MIL/uL (ref 3.87–5.11)
RDW: 13.2 % (ref 11.5–15.5)
WBC: 4.6 10*3/uL (ref 4.0–10.5)
nRBC: 0 % (ref 0.0–0.2)

## 2018-12-06 LAB — LIPASE, BLOOD: Lipase: 26 U/L (ref 11–51)

## 2018-12-06 LAB — URINALYSIS, MICROSCOPIC (REFLEX)

## 2018-12-06 LAB — HCG, SERUM, QUALITATIVE: Preg, Serum: NEGATIVE

## 2018-12-06 LAB — OCCULT BLOOD X 1 CARD TO LAB, STOOL: Fecal Occult Bld: NEGATIVE

## 2018-12-06 MED ORDER — CIPROFLOXACIN HCL 500 MG PO TABS: 500.0000 mg | ORAL_TABLET | Freq: Once | ORAL | Status: DC

## 2018-12-06 MED ORDER — METRONIDAZOLE 500 MG PO TABS: 500.0000 mg | ORAL_TABLET | Freq: Once | ORAL | Status: DC

## 2018-12-06 MED ORDER — SODIUM CHLORIDE 0.9 % IV BOLUS
1000.0000 mL | Freq: Once | INTRAVENOUS | Status: AC
Start: 2018-12-06 — End: 2018-12-06
  Administered 2018-12-06: 1000 mL via INTRAVENOUS

## 2018-12-06 MED ORDER — METRONIDAZOLE 500 MG PO TABS: 500.0000 mg | ORAL_TABLET | Freq: Two times a day (BID) | ORAL | 0 refills | Status: DC

## 2018-12-06 MED ORDER — HYDROCODONE-ACETAMINOPHEN 5-325 MG PO TABS: 1.0000 | ORAL_TABLET | ORAL | 0 refills | Status: DC | PRN

## 2018-12-06 MED ORDER — IOHEXOL 300 MG/ML  SOLN
100.0000 mL | Freq: Once | INTRAMUSCULAR | Status: AC | PRN
  Administered 2018-12-06: 100 mL via INTRAVENOUS

## 2018-12-06 MED ORDER — ONDANSETRON HCL 4 MG/2ML IJ SOLN
4.0000 mg | Freq: Once | INTRAMUSCULAR | Status: AC
  Administered 2018-12-06: 4 mg via INTRAVENOUS
  Filled 2018-12-06: qty 2

## 2018-12-06 MED ORDER — PROMETHAZINE HCL 25 MG PO TABS: 25.0000 mg | ORAL_TABLET | Freq: Four times a day (QID) | ORAL | 0 refills | Status: DC | PRN

## 2018-12-06 MED ORDER — CIPROFLOXACIN HCL 500 MG PO TABS: 500.0000 mg | ORAL_TABLET | Freq: Two times a day (BID) | ORAL | 0 refills | Status: DC

## 2018-12-06 MED FILL — PROMETHAZINE 25 MG TABLET: 25 | 3 days supply | Qty: 10 | Fill #0

## 2018-12-06 MED FILL — HYDROCODON-APAP 5-325: 5-325 | 2 days supply | Qty: 10 | Fill #0

## 2018-12-06 MED FILL — metroNIDAZOLE 500 MG TABS: 500 | 7 days supply | Qty: 14 | Fill #0

## 2018-12-06 MED FILL — CIPROFLOXACIN HCL 500 MG TA: 500 | 7 days supply | Qty: 14 | Fill #0

## 2018-12-06 NOTE — ED Provider Notes (Signed)
[facility] EMERGENCY DEPARTMENT Provider Note   CSN: [id number] Arrival date & time: 12/06/18  1218    History   Chief Complaint Chief Complaint  Patient presents with  . Abdominal Pain    HPI Sheri Rodriguez is a 33 y.o. female.     Pt presents to the ED today with diarrhea, n/v, abdominal pain.  She said it's been going on for a few days.  She had a fever of 101 yesterday for which she took tylenol.  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  She does not know if anyone's been sick.  Pt has also had a h/a and a cough and left fingers numb.     Past Medical History:  Diagnosis Date  . Asthma   . Hypertension     There are no active problems to display for this patient.   No past surgical history on file.   OB History    Gravida  1   Para      Term      Preterm      AB      Living        SAB      TAB      Ectopic      Multiple      Live Births               Home Medications    Prior to Admission medications   Medication Sig Start Date End Date Taking? Authorizing Provider  albuterol (PROVENTIL HFA;VENTOLIN HFA) 108 (90 Base) MCG/ACT inhaler Inhale 1-2 puffs into the lungs every 6 (six) hours as needed for wheezing or shortness of breath. 07/25/18   Ward, Chase PicketJaime Pilcher, PA-C  ciprofloxacin (CIPRO) 500 MG tablet Take 1 tablet (500 mg total) by mouth 2 (two) times daily. 12/06/18   Jacalyn LefevreHaviland, Terressa Evola, MD  HYDROcodone-acetaminophen (NORCO/VICODIN) 5-325 MG tablet Take 1 tablet by mouth every 4 (four) hours as needed. 12/06/18   Jacalyn LefevreHaviland, Dierdre Mccalip, MD  metroNIDAZOLE (FLAGYL) 500 MG tablet Take 1 tablet (500 mg total) by mouth 2 (two) times daily. 12/06/18   Jacalyn LefevreHaviland, Keondrick Dilks, MD  promethazine (PHENERGAN) 25 MG tablet Take 1 tablet (25 mg total) by mouth every 6 (six) hours as needed for nausea or vomiting. 12/06/18   Jacalyn LefevreHaviland, Esteven Overfelt, MD    Family History No family history on file.  Social History  Social History   Tobacco Use  . Smoking status: Current Every Day Smoker  . Smokeless tobacco: Never Used  Substance Use Topics  . Alcohol use: No    Frequency: Never  . Drug use: No     Allergies   Aspirin   Review of Systems Review of Systems  Constitutional: Positive for fever.  Respiratory: Positive for cough.   Gastrointestinal: Positive for abdominal pain, diarrhea and nausea.  Neurological: Positive for numbness.  All other systems reviewed and are negative.    Physical Exam Updated Vital Signs BP (!) 139/103 (BP Location: Right Arm)   Pulse 98   Temp 99.3 F (37.4 C) (Oral)   Resp 16   Ht 5\' 7"  (1.702 m)   Wt (!) 139.7 kg   LMP 11/14/2018   SpO2 100%   BMI 48.24 kg/m   Physical Exam Vitals signs and nursing note reviewed.  Constitutional:      Appearance: Normal appearance.  HENT:     Head: Normocephalic and atraumatic.     Right Ear:  External ear normal.     Left Ear: External ear normal.     Nose: Nose normal.     Mouth/Throat:     Mouth: Mucous membranes are dry.  Eyes:     Extraocular Movements: Extraocular movements intact.     Conjunctiva/sclera: Conjunctivae normal.     Pupils: Pupils are equal, round, and reactive to light.  Neck:     Musculoskeletal: Normal range of motion and neck supple.  Cardiovascular:     Rate and Rhythm: Regular rhythm. Tachycardia present.     Pulses: Normal pulses.     Heart sounds: Normal heart sounds.  Pulmonary:     Effort: Pulmonary effort is normal.     Breath sounds: Normal breath sounds.  Abdominal:     General: Abdomen is flat. Bowel sounds are normal.     Palpations: Abdomen is soft.     Tenderness: There is generalized abdominal tenderness.  Genitourinary:    Rectum: Guaiac result negative.  Musculoskeletal: Normal range of motion.  Skin:    General: Skin is warm.     Capillary Refill: Capillary refill takes less than 2 seconds.  Neurological:     General: No focal deficit present.      Mental Status: She is alert and oriented to person, place, and time.  Psychiatric:        Mood and Affect: Mood normal.        Behavior: Behavior normal.      ED Treatments / Results  Labs (all labs ordered are listed, but only abnormal results are displayed) Labs Reviewed  COMPREHENSIVE METABOLIC PANEL - Abnormal; Notable for the following components:      Result Value   Glucose, Bld 102 (*)    BUN 5 (*)    Calcium 8.2 (*)    All other components within normal limits  URINALYSIS, ROUTINE W REFLEX MICROSCOPIC - Abnormal; Notable for the following components:   Specific Gravity, Urine >1.030 (*)    Hgb urine dipstick TRACE (*)    Protein, ur 100 (*)    All other components within normal limits  URINALYSIS, MICROSCOPIC (REFLEX) - Abnormal; Notable for the following components:   Bacteria, UA RARE (*)    All other components within normal limits  CBC WITH DIFFERENTIAL/PLATELET  LIPASE, BLOOD  HCG, SERUM, QUALITATIVE  OCCULT BLOOD X 1 CARD TO LAB, STOOL    EKG None  Radiology Dg Chest 2 View  Result Date: 12/06/2018 CLINICAL DATA:  Abdominal pain and diarrhea for 2 days. EXAM: CHEST - 2 VIEW COMPARISON:  PA and lateral chest 07/25/2018. FINDINGS: Lungs clear. Heart size normal. No pneumothorax or pleural fluid. No bony abnormality. IMPRESSION: Negative chest. Electronically Signed   By: Drusilla Kanner M.D.   On: 12/06/2018 13:46   Ct Abdomen Pelvis W Contrast  Result Date: 12/06/2018 CLINICAL DATA:  LEFT-sided abdominal pain and diarrhea for few days. Headache, history of hypertension, asthma. EXAM: CT ABDOMEN AND PELVIS WITH CONTRAST TECHNIQUE: Multidetector CT imaging of the abdomen and pelvis was performed using the standard protocol following bolus administration of intravenous contrast. CONTRAST:  OMNIPAQUE IOHEXOL 300 MG/ML  SOLN COMPARISON:  None. FINDINGS: Lower chest: No acute abnormality. Hepatobiliary: No focal liver abnormality is seen. No gallstones,  gallbladder wall thickening, or biliary dilatation. Pancreas: Unremarkable. No pancreatic ductal dilatation or surrounding inflammatory changes. Spleen: Normal in size without focal abnormality. Adrenals/Urinary Tract: Adrenal glands appear normal. Kidneys are unremarkable without mass, stone or hydronephrosis. No ureteral or bladder calculi  identified. Bladder is decompressed. Stomach/Bowel: No dilated large or small bowel loops. No direct evidence of bowel wall inflammation. Fluid is present throughout a significant portion of the small bowel which can be a secondary indication of underlying enteritis. Stomach is unremarkable, partially decompressed. Appendix is normal. Vascular/Lymphatic: No significant vascular findings are present. No enlarged abdominal or pelvic lymph nodes. Mildly prominent lymph nodes within the central mesentery raising the possibility of a mild mesenteric adenitis. Reproductive: Intrauterine device appears appropriately positioned within the uterus. No suspicious adnexal mass or free fluid. Other: No free fluid or abscess collection. No free intraperitoneal air. Musculoskeletal: No acute or suspicious osseous finding. IMPRESSION: 1. Fluid throughout a significant portion of the small bowel which can be a secondary indication of underlying enteritis of infectious or inflammatory nature. 2. Mildly prominent lymph nodes within the central mesentery raising the possibility of an associated mild mesenteric adenitis. 3. No bowel obstruction. No free fluid or abscess collection. No evidence of acute solid organ abnormality. No renal or ureteral calculi. Appendix is normal. Electronically Signed   By: Bary Richard M.D.   On: 12/06/2018 13:47    Procedures Procedures (including critical care time)  Medications Ordered in ED Medications  ciprofloxacin (CIPRO) tablet 500 mg (has no administration in time range)  metroNIDAZOLE (FLAGYL) tablet 500 mg (has no administration in time range)   ondansetron (ZOFRAN) injection 4 mg (4 mg Intravenous Given 12/06/18 1252)  sodium chloride 0.9 % bolus 1,000 mL (1,000 mLs Intravenous New Bag/Given 12/06/18 1252)  iohexol (OMNIPAQUE) 300 MG/ML solution 100 mL (100 mLs Intravenous Contrast Given 12/06/18 1332)     Initial Impression / Assessment and Plan / ED Course  I have reviewed the triage vital signs and the nursing notes.  Pertinent labs & imaging results that were available during my care of the patient were reviewed by me and considered in my medical decision making (see chart for details).       Pt is feeling much better after treatment.  She will be started on cipro/flagyl in case she has an infectious source.  She knows to return if worse.  F/u with pcp.  Final Clinical Impressions(s) / ED Diagnoses   Final diagnoses:  Dehydration  Generalized abdominal pain  Nausea vomiting and diarrhea    ED Discharge Orders         Ordered    ciprofloxacin (CIPRO) 500 MG tablet  2 times daily     12/06/18 1412    metroNIDAZOLE (FLAGYL) 500 MG tablet  2 times daily     12/06/18 1412    promethazine (PHENERGAN) 25 MG tablet  Every 6 hours PRN     12/06/18 1412    HYDROcodone-acetaminophen (NORCO/VICODIN) 5-325 MG tablet  Every 4 hours PRN     12/06/18 1412           Jacalyn Lefevre, MD 12/06/18 1413

## 2018-12-06 NOTE — ED Triage Notes (Signed)
Pt has been having abdominal pain with diarrhea since yesterday.  Headache started the day before and then switched to diarrhea.

## 2018-12-11 ENCOUNTER — Emergency Department (HOSPITAL_BASED_OUTPATIENT_CLINIC_OR_DEPARTMENT_OTHER)
Admission: EM | Admit: 2018-12-11 | Discharge: 2018-12-11 | Disposition: A | Discharge status: Home / Self Care | Payer: Self-pay | Attending: Emergency Medicine | Admitting: Emergency Medicine

## 2018-12-11 ENCOUNTER — Encounter (HOSPITAL_BASED_OUTPATIENT_CLINIC_OR_DEPARTMENT_OTHER): Payer: Self-pay

## 2018-12-11 ENCOUNTER — Other Ambulatory Visit: Payer: Self-pay

## 2018-12-11 DIAGNOSIS — F17210 Nicotine dependence, cigarettes, uncomplicated: Secondary | ICD-10-CM | POA: Insufficient documentation

## 2018-12-11 DIAGNOSIS — E860 Dehydration: Secondary | ICD-10-CM | POA: Insufficient documentation

## 2018-12-11 DIAGNOSIS — J45909 Unspecified asthma, uncomplicated: Secondary | ICD-10-CM | POA: Insufficient documentation

## 2018-12-11 DIAGNOSIS — R197 Diarrhea, unspecified: Secondary | ICD-10-CM | POA: Insufficient documentation

## 2018-12-11 DIAGNOSIS — R112 Nausea with vomiting, unspecified: Secondary | ICD-10-CM | POA: Insufficient documentation

## 2018-12-11 DIAGNOSIS — I10 Essential (primary) hypertension: Secondary | ICD-10-CM | POA: Insufficient documentation

## 2018-12-11 LAB — CBC WITH DIFFERENTIAL/PLATELET
Abs Immature Granulocytes: 0.02 10*3/uL (ref 0.00–0.07)
Basophils Absolute: 0 10*3/uL (ref 0.0–0.1)
Basophils Relative: 0 %
Eosinophils Absolute: 0.2 10*3/uL (ref 0.0–0.5)
Eosinophils Relative: 2 %
HCT: 44.6 % (ref 36.0–46.0)
Hemoglobin: 13.9 g/dL (ref 12.0–15.0)
Immature Granulocytes: 0 %
Lymphocytes Relative: 41 %
Lymphs Abs: 3 10*3/uL (ref 0.7–4.0)
MCH: 26.5 pg (ref 26.0–34.0)
MCHC: 31.2 g/dL (ref 30.0–36.0)
MCV: 85.1 fL (ref 80.0–100.0)
Monocytes Absolute: 0.5 10*3/uL (ref 0.1–1.0)
Monocytes Relative: 7 %
Neutro Abs: 3.7 10*3/uL (ref 1.7–7.7)
Neutrophils Relative %: 50 %
Platelets: 380 10*3/uL (ref 150–400)
RBC: 5.24 MIL/uL — ABNORMAL HIGH (ref 3.87–5.11)
RDW: 13.1 % (ref 11.5–15.5)
WBC: 7.4 10*3/uL (ref 4.0–10.5)
nRBC: 0 % (ref 0.0–0.2)

## 2018-12-11 LAB — URINALYSIS, ROUTINE W REFLEX MICROSCOPIC
Bilirubin Urine: NEGATIVE
Glucose, UA: NEGATIVE mg/dL
Hgb urine dipstick: NEGATIVE
Ketones, ur: NEGATIVE mg/dL
Nitrite: NEGATIVE
Protein, ur: 30 mg/dL — AB
Specific Gravity, Urine: >1.03 — ABNORMAL HIGH (ref 1.005–1.030)
pH: 6 (ref 5.0–8.0)

## 2018-12-11 LAB — URINALYSIS, MICROSCOPIC (REFLEX)

## 2018-12-11 LAB — COMPREHENSIVE METABOLIC PANEL
ALT: 45 U/L — ABNORMAL HIGH (ref 0–44)
AST: 60 U/L — ABNORMAL HIGH (ref 15–41)
Albumin: 4.1 g/dL (ref 3.5–5.0)
Alkaline Phosphatase: 61 U/L (ref 38–126)
Anion gap: 7 (ref 5–15)
BUN: 5 mg/dL — ABNORMAL LOW (ref 6–20)
CO2: 26 mmol/L (ref 22–32)
Calcium: 8.8 mg/dL — ABNORMAL LOW (ref 8.9–10.3)
Chloride: 102 mmol/L (ref 98–111)
Creatinine, Ser: 0.92 mg/dL (ref 0.44–1.00)
GFR calc Af Amer: >60 mL/min (ref >60)
GFR calc non Af Amer: >60 mL/min (ref >60)
Glucose, Bld: 110 mg/dL — ABNORMAL HIGH (ref 70–99)
Potassium: 3.4 mmol/L — ABNORMAL LOW (ref 3.5–5.1)
Sodium: 135 mmol/L (ref 135–145)
Total Bilirubin: 0.3 mg/dL (ref 0.3–1.2)
Total Protein: 8 g/dL (ref 6.5–8.1)

## 2018-12-11 LAB — PREGNANCY, URINE: Preg Test, Ur: NEGATIVE

## 2018-12-11 LAB — LIPASE, BLOOD: Lipase: 27 U/L (ref 11–51)

## 2018-12-11 MED ORDER — ONDANSETRON HCL 4 MG/2ML IJ SOLN
4.0000 mg | Freq: Once | INTRAMUSCULAR | Status: AC
  Administered 2018-12-11: 4 mg via INTRAVENOUS
  Filled 2018-12-11: qty 2

## 2018-12-11 MED ORDER — ONDANSETRON 4 MG PO TBDP: 4.0000 mg | ORAL_TABLET | Freq: Three times a day (TID) | ORAL | 0 refills | Status: DC | PRN

## 2018-12-11 MED ORDER — SODIUM CHLORIDE 0.9 % IV BOLUS
1000.0000 mL | Freq: Once | INTRAVENOUS | Status: AC
  Administered 2018-12-11: 1000 mL via INTRAVENOUS

## 2018-12-11 NOTE — ED Notes (Signed)
Provided po fluids for po challenge

## 2018-12-11 NOTE — ED Triage Notes (Signed)
Pt states was seen last week for n/v/d, started on phenergan, cipro, flagyl, vicodin.  Pt states symptoms not resolved.  Continues to vomit with attempts at po food/fluids

## 2018-12-11 NOTE — ED Provider Notes (Signed)
[facility] EMERGENCY DEPARTMENT Provider Note   CSN: [id number] Arrival date & time: 12/11/18  0803    History   Chief Complaint Chief Complaint  Patient presents with  . Emesis  . Diarrhea    HPI Sheri Rodriguez is a 33 y.o. female.     33 year old female with past medical history including hypertension and asthma who presents with nausea, vomiting, and diarrhea.  Patient presented here to the ED on 4/23 for vomiting and diarrhea.  She had work-up including CT scan, was diagnosed with gastroenteritis and discharged with prescriptions for ciprofloxacin, Flagyl, and Phenergan.  She states she has been taking all of these medications without improvement in her symptoms.  She reports that the medicines make her sleepy but did not seem to help.  She skipped the medications last night so that she could go to work but otherwise has been compliant.  She reports vomiting just prior to arrival and diarrhea earlier this morning.  She has been having 3-4 episodes of nonbloody diarrhea per day.  No associated urinary symptoms, vaginal bleeding/discharge, cough/cold symptoms, sick contacts, or recent travel.  She denies any drug or alcohol use.  She states that every time she tries to drink or eat something, she either vomits or has an episode of diarrhea.  The history is provided by the patient.  Emesis  Associated symptoms: diarrhea   Diarrhea  Associated symptoms: vomiting     Past Medical History:  Diagnosis Date  . Asthma   . Hypertension     There are no active problems to display for this patient.   History reviewed. No pertinent surgical history.   OB History    Gravida  1   Para      Term      Preterm      AB      Living        SAB      TAB      Ectopic      Multiple      Live Births               Home Medications    Prior to Admission medications   Medication Sig Start Date End Date Taking? Authorizing Provider  albuterol (PROVENTIL  HFA;VENTOLIN HFA) 108 (90 Base) MCG/ACT inhaler Inhale 1-2 puffs into the lungs every 6 (six) hours as needed for wheezing or shortness of breath. 07/25/18   Ward, Chase Picket, PA-C  ciprofloxacin (CIPRO) 500 MG tablet Take 1 tablet (500 mg total) by mouth 2 (two) times daily. 12/06/18   Jacalyn Lefevre, MD  HYDROcodone-acetaminophen (NORCO/VICODIN) 5-325 MG tablet Take 1 tablet by mouth every 4 (four) hours as needed. 12/06/18   Jacalyn Lefevre, MD  metroNIDAZOLE (FLAGYL) 500 MG tablet Take 1 tablet (500 mg total) by mouth 2 (two) times daily. 12/06/18   Jacalyn Lefevre, MD  ondansetron (ZOFRAN ODT) 4 MG disintegrating tablet Take 1 tablet (4 mg total) by mouth every 8 (eight) hours as needed for nausea or vomiting. 12/11/18   Feiga Nadel, Ambrose Finland, MD  promethazine (PHENERGAN) 25 MG tablet Take 1 tablet (25 mg total) by mouth every 6 (six) hours as needed for nausea or vomiting. 12/06/18   Jacalyn Lefevre, MD    Family History History reviewed. No pertinent family history.  Social History Social History   Tobacco Use  . Smoking status: Current Every Day Smoker  . Smokeless tobacco: Never Used  Substance Use Topics  . Alcohol use: No  Frequency: Never  . Drug use: No     Allergies   Aspirin   Review of Systems Review of Systems  Gastrointestinal: Positive for diarrhea and vomiting.   All other systems reviewed and are negative except that which was mentioned in HPI   Physical Exam Updated Vital Signs BP (!) 145/99 (BP Location: Right Arm)   Pulse 89   Temp 98.5 F (36.9 C) (Oral)   Resp 16   Ht 5\' 7"  (1.702 m)   Wt (!) 139.7 kg   LMP 11/14/2018   SpO2 97%   BMI 48.24 kg/m   Physical Exam Vitals signs and nursing note reviewed.  Constitutional:      General: She is not in acute distress.    Appearance: She is well-developed.  HENT:     Head: Normocephalic and atraumatic.     Nose: Nose normal.     Mouth/Throat:     Mouth: Mucous membranes are moist.      Pharynx: Oropharynx is clear.  Eyes:     Conjunctiva/sclera: Conjunctivae normal.  Neck:     Musculoskeletal: Neck supple.  Cardiovascular:     Rate and Rhythm: Normal rate and regular rhythm.     Pulses: Normal pulses.  Pulmonary:     Effort: Pulmonary effort is normal.  Abdominal:     General: There is no distension.     Palpations: Abdomen is soft.     Tenderness: There is no abdominal tenderness.  Musculoskeletal:     Right lower leg: No edema.     Left lower leg: No edema.  Skin:    General: Skin is warm and dry.  Neurological:     Mental Status: She is alert and oriented to person, place, and time.     Comments: Fluent speech  Psychiatric:        Judgment: Judgment normal.      ED Treatments / Results  Labs (all labs ordered are listed, but only abnormal results are displayed) Labs Reviewed  COMPREHENSIVE METABOLIC PANEL - Abnormal; Notable for the following components:      Result Value   Potassium 3.4 (*)    Glucose, Bld 110 (*)    BUN 5 (*)    Calcium 8.8 (*)    AST 60 (*)    ALT 45 (*)    All other components within normal limits  URINALYSIS, ROUTINE W REFLEX MICROSCOPIC - Abnormal; Notable for the following components:   Specific Gravity, Urine >1.030 (*)    Protein, ur 30 (*)    Leukocytes,Ua TRACE (*)    All other components within normal limits  CBC WITH DIFFERENTIAL/PLATELET - Abnormal; Notable for the following components:   RBC 5.24 (*)    All other components within normal limits  URINALYSIS, MICROSCOPIC (REFLEX) - Abnormal; Notable for the following components:   Bacteria, UA FEW (*)    All other components within normal limits  LIPASE, BLOOD  PREGNANCY, URINE    EKG None  Radiology No results found.  Procedures Procedures (including critical care time)  Medications Ordered in ED Medications  sodium chloride 0.9 % bolus 1,000 mL (0 mLs Intravenous Stopped 12/11/18 0925)  ondansetron (ZOFRAN) injection 4 mg (4 mg Intravenous Given  12/11/18 [id number])     Initial Impression / Assessment and Plan / ED Course  I have reviewed the triage vital signs and the nursing notes.  Pertinent labs & imaging results that were available during my care of the patient were reviewed by me  and considered in my medical decision making (see chart for details).       Well appearing on exam, afebrile w/ reassuring VS. No abdominal tenderness. I reviewed recent CT scan which showed bowel changes c/w enteritis. Labs today show normal CBC, normal creatinine, K 3.4, AST 60, ALT 45 likely 2/2 viral process.  After receiving IV fluids and Zofran, she was feeling better on reassessment and has been able to drink fluids in the ED.  She has had no episodes of diarrhea here.  I discussed supportive measures including continued hydration at home, provided with Zofran to use as needed, and reviewed return precautions regarding any worsening symptoms or abdominal pain.  She voiced understanding.  Final Clinical Impressions(s) / ED Diagnoses   Final diagnoses:  Nausea vomiting and diarrhea    ED Discharge Orders         Ordered    ondansetron (ZOFRAN ODT) 4 MG disintegrating tablet  Every 8 hours PRN     12/11/18 1028           [place] Behlke, Ambrose Finland, MD 12/11/18 1030

## 2018-12-11 NOTE — ED Notes (Signed)
Pt unable to provide urine specimen at this time, aware of need for urine specimen when able.

## 2018-12-11 NOTE — ED Notes (Signed)
Tolerating po without vomiting

## 2018-12-11 NOTE — ED Notes (Signed)
Pt ambulated to bathroom with steady gait to provide urine specimen 

## 2018-12-11 NOTE — Discharge Instructions (Addendum)
Drink plenty of fluids such as Pedialyte, Gatorade, or water.  Start taking probiotics/lactobacillus 3 times daily to help with diarrhea.  Return to ER if you develop severe abdominal pain or high fevers, or if vomiting continues.

## 2019-02-21 ENCOUNTER — Other Ambulatory Visit: Payer: Self-pay | Admitting: *Deleted

## 2019-02-21 DIAGNOSIS — Z20822 Contact with and (suspected) exposure to covid-19: Secondary | ICD-10-CM

## 2019-02-23 NOTE — Addendum Note (Signed)
Addended by: Kirklin Mcduffee M on: 02/23/2019 09:32 AM   Modules accepted: Orders  

## 2019-04-29 ENCOUNTER — Other Ambulatory Visit: Payer: Self-pay

## 2019-04-29 ENCOUNTER — Emergency Department (HOSPITAL_BASED_OUTPATIENT_CLINIC_OR_DEPARTMENT_OTHER)
Admission: EM | Admit: 2019-04-29 | Discharge: 2019-04-29 | Discharge status: Against Medical Advice | Payer: Self-pay | Attending: Emergency Medicine | Admitting: Emergency Medicine

## 2019-04-29 DIAGNOSIS — Z5321 Procedure and treatment not carried out due to patient leaving prior to being seen by health care provider: Secondary | ICD-10-CM | POA: Insufficient documentation

## 2019-08-03 ENCOUNTER — Other Ambulatory Visit: Payer: Self-pay

## 2019-08-03 ENCOUNTER — Emergency Department (HOSPITAL_BASED_OUTPATIENT_CLINIC_OR_DEPARTMENT_OTHER)
Admission: EM | Admit: 2019-08-03 | Discharge: 2019-08-03 | Disposition: A | Discharge status: Home / Self Care | Payer: Self-pay | Attending: Emergency Medicine | Admitting: Emergency Medicine

## 2019-08-03 ENCOUNTER — Encounter (HOSPITAL_BASED_OUTPATIENT_CLINIC_OR_DEPARTMENT_OTHER): Payer: Self-pay | Admitting: Emergency Medicine

## 2019-08-03 ENCOUNTER — Emergency Department (HOSPITAL_BASED_OUTPATIENT_CLINIC_OR_DEPARTMENT_OTHER): Payer: Self-pay

## 2019-08-03 DIAGNOSIS — R0602 Shortness of breath: Secondary | ICD-10-CM | POA: Insufficient documentation

## 2019-08-03 DIAGNOSIS — F17210 Nicotine dependence, cigarettes, uncomplicated: Secondary | ICD-10-CM | POA: Insufficient documentation

## 2019-08-03 DIAGNOSIS — Z20828 Contact with and (suspected) exposure to other viral communicable diseases: Secondary | ICD-10-CM | POA: Insufficient documentation

## 2019-08-03 DIAGNOSIS — Z885 Allergy status to narcotic agent status: Secondary | ICD-10-CM | POA: Insufficient documentation

## 2019-08-03 DIAGNOSIS — I10 Essential (primary) hypertension: Secondary | ICD-10-CM | POA: Insufficient documentation

## 2019-08-03 DIAGNOSIS — J45909 Unspecified asthma, uncomplicated: Secondary | ICD-10-CM | POA: Insufficient documentation

## 2019-08-03 DIAGNOSIS — R0789 Other chest pain: Secondary | ICD-10-CM | POA: Insufficient documentation

## 2019-08-03 LAB — CBC WITH DIFFERENTIAL/PLATELET
Abs Immature Granulocytes: 0.02 10*3/uL (ref 0.00–0.07)
Basophils Absolute: 0 10*3/uL (ref 0.0–0.1)
Basophils Relative: 1 %
Eosinophils Absolute: 0.3 10*3/uL (ref 0.0–0.5)
Eosinophils Relative: 4 %
HCT: 42.9 % (ref 36.0–46.0)
Hemoglobin: 13.7 g/dL (ref 12.0–15.0)
Immature Granulocytes: 0 %
Lymphocytes Relative: 43 %
Lymphs Abs: 3.7 10*3/uL (ref 0.7–4.0)
MCH: 27.3 pg (ref 26.0–34.0)
MCHC: 31.9 g/dL (ref 30.0–36.0)
MCV: 85.6 fL (ref 80.0–100.0)
Monocytes Absolute: 0.5 10*3/uL (ref 0.1–1.0)
Monocytes Relative: 6 %
Neutro Abs: 4.1 10*3/uL (ref 1.7–7.7)
Neutrophils Relative %: 46 %
Platelets: 337 10*3/uL (ref 150–400)
RBC: 5.01 MIL/uL (ref 3.87–5.11)
RDW: 13.5 % (ref 11.5–15.5)
WBC: 8.7 10*3/uL (ref 4.0–10.5)
nRBC: 0 % (ref 0.0–0.2)

## 2019-08-03 LAB — COMPREHENSIVE METABOLIC PANEL
ALT: 24 U/L (ref 0–44)
AST: 20 U/L (ref 15–41)
Albumin: 3.7 g/dL (ref 3.5–5.0)
Alkaline Phosphatase: 54 U/L (ref 38–126)
Anion gap: 7 (ref 5–15)
BUN: 9 mg/dL (ref 6–20)
CO2: 29 mmol/L (ref 22–32)
Calcium: 9.1 mg/dL (ref 8.9–10.3)
Chloride: 101 mmol/L (ref 98–111)
Creatinine, Ser: 0.93 mg/dL (ref 0.44–1.00)
GFR calc Af Amer: >60 mL/min (ref >60)
GFR calc non Af Amer: >60 mL/min (ref >60)
Glucose, Bld: 106 mg/dL — ABNORMAL HIGH (ref 70–99)
Potassium: 3.5 mmol/L (ref 3.5–5.1)
Sodium: 137 mmol/L (ref 135–145)
Total Bilirubin: 0.4 mg/dL (ref 0.3–1.2)
Total Protein: 7.1 g/dL (ref 6.5–8.1)

## 2019-08-03 LAB — PREGNANCY, URINE: Preg Test, Ur: NEGATIVE

## 2019-08-03 LAB — URINALYSIS, ROUTINE W REFLEX MICROSCOPIC
Bilirubin Urine: NEGATIVE
Glucose, UA: NEGATIVE mg/dL
Hgb urine dipstick: NEGATIVE
Ketones, ur: NEGATIVE mg/dL
Leukocytes,Ua: NEGATIVE
Nitrite: NEGATIVE
Protein, ur: NEGATIVE mg/dL
Specific Gravity, Urine: 1.015 (ref 1.005–1.030)
pH: 8 (ref 5.0–8.0)

## 2019-08-03 LAB — SARS CORONAVIRUS 2 AG (30 MIN TAT): SARS Coronavirus 2 Ag: NEGATIVE

## 2019-08-03 MED ORDER — ALBUTEROL SULFATE HFA 108 (90 BASE) MCG/ACT IN AERS: 1.0000 | INHALATION_SPRAY | Freq: Four times a day (QID) | RESPIRATORY_TRACT | 0 refills | Status: AC | PRN

## 2019-08-03 MED ORDER — PREDNISONE 50 MG PO TABS: 50.0000 mg | ORAL_TABLET | Freq: Every day | ORAL | 0 refills | Status: DC

## 2019-08-03 NOTE — ED Triage Notes (Signed)
SOB for several days. Hx of asthma and using inhalers. Also reports swelling to feet and ankles and concerned about a COVID exposure. No distress.

## 2019-08-03 NOTE — ED Notes (Signed)
Pt c/o SOB and swollen ankles, states has been keeping them elevated and they are better now, also c/o sharp pain around rib cage with sudden movement or laughing, denies at present, states her boyfriend's roommate's girlfriend is positive for covid

## 2019-08-03 NOTE — Discharge Instructions (Signed)
Take prednisone as prescribed until completed.  Use albuterol inhaler every 6 hours as needed for nausea or vomiting.  I recommend following up and establishing care with a primary care provider for further evaluation and treatment of your asthma other maintenance.  Please have them recheck your blood pressure, as it was elevated today.  Please return to the emergency department if you develop any new or worsening symptoms.
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
## 2019-09-10 ENCOUNTER — Ambulatory Visit: Payer: Self-pay | Attending: Internal Medicine

## 2019-09-10 DIAGNOSIS — Z20822 Contact with and (suspected) exposure to covid-19: Secondary | ICD-10-CM | POA: Insufficient documentation

## 2019-09-11 LAB — NOVEL CORONAVIRUS, NAA: SARS-CoV-2, NAA: NOT DETECTED

## 2020-09-23 IMAGING — CT CT ABDOMEN AND PELVIS WITH CONTRAST
2 of 4 series · 16 of 46 positions shown, 18 images · IV contrast (omnipaque)
Comparison: None.

CLINICAL DATA: LEFT-sided abdominal pain and diarrhea for few days.
Headache, history of hypertension, asthma.

EXAM:
CT ABDOMEN AND PELVIS WITH CONTRAST
TECHNIQUE: Multidetector CT imaging of the abdomen and pelvis was performed
using the standard protocol following bolus administration of
intravenous contrast.
CONTRAST:  100mL OMNIPAQUE IOHEXOL 300 MG/ML  SOLN

[Series 2: axial st · axial · 0.98mm/px · z∈[-405,+45]mm · 13 of 100 slices shown, 15 images]
[im 5/100  soft-tissue]
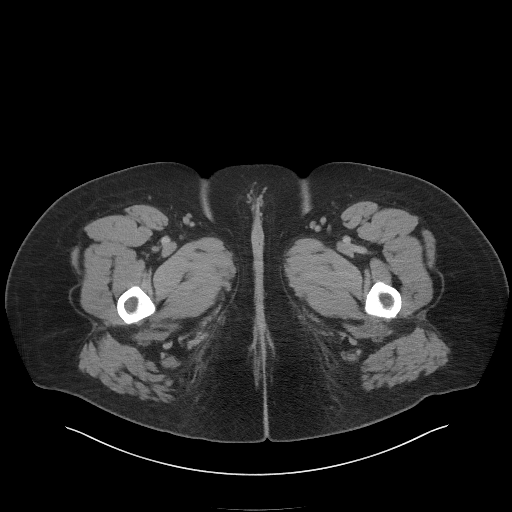
[im 5/100  bone]
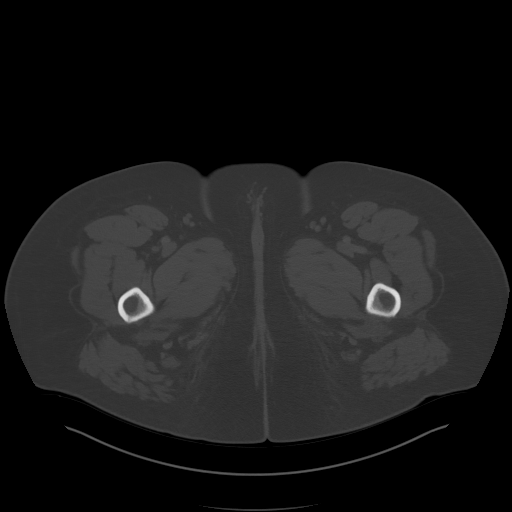
[im 13/100  soft-tissue]
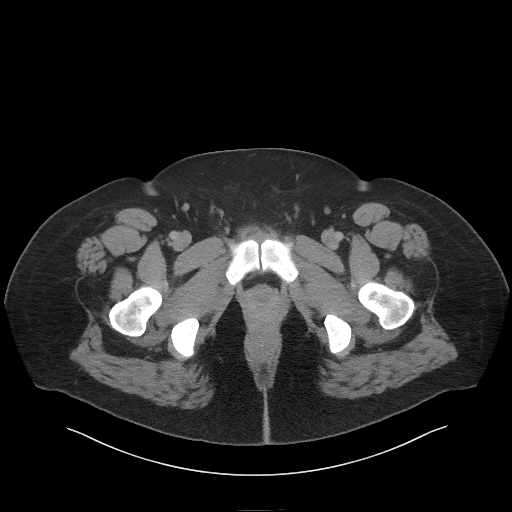
[im 21/100  soft-tissue]
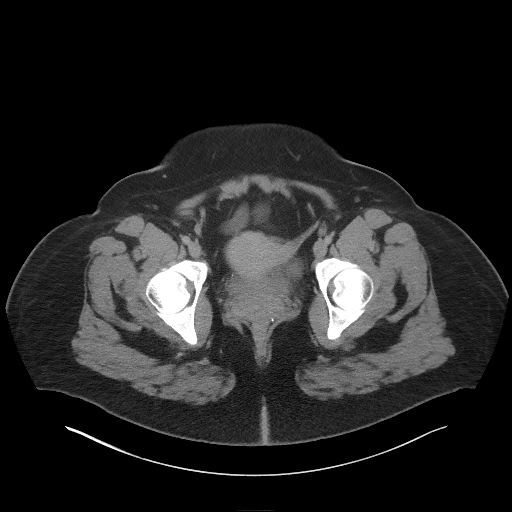
[im 29/100  soft-tissue]
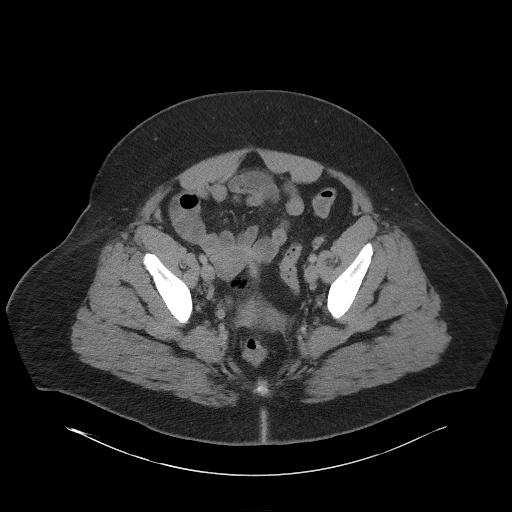
[im 34/100  soft-tissue]
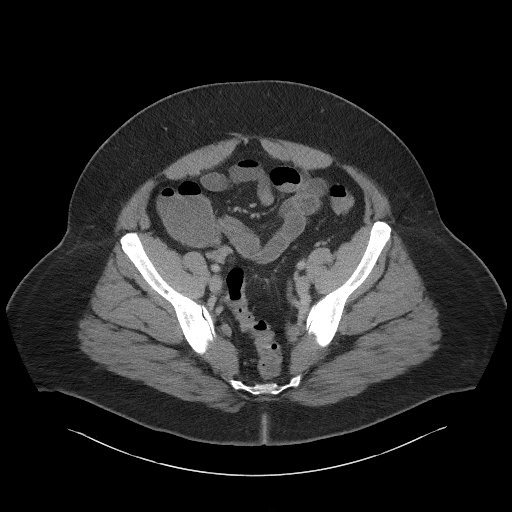
[im 42/100  soft-tissue]
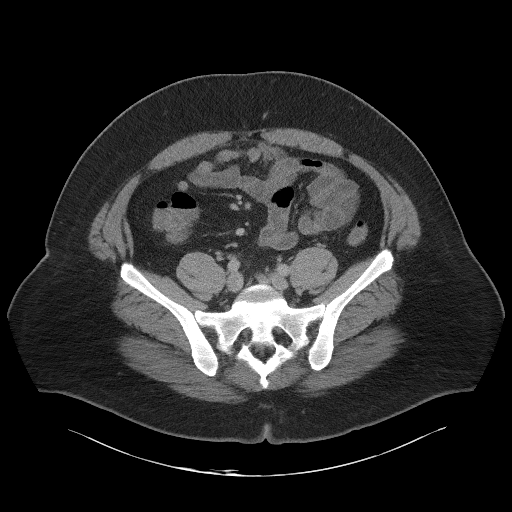
[im 50/100  soft-tissue]
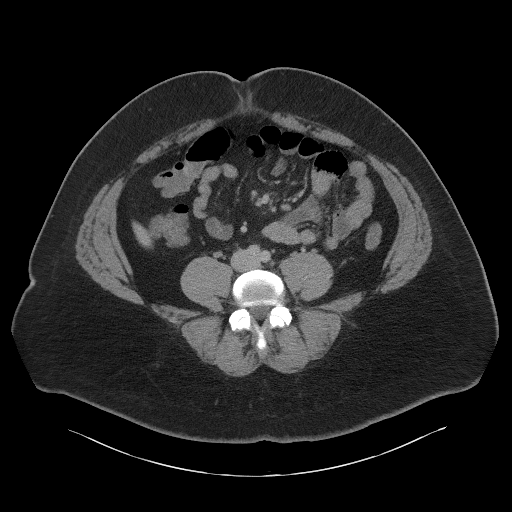
[im 58/100  soft-tissue]
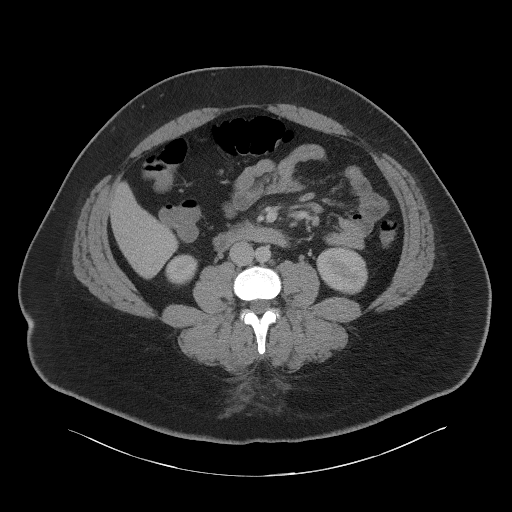
[im 67/100  soft-tissue]
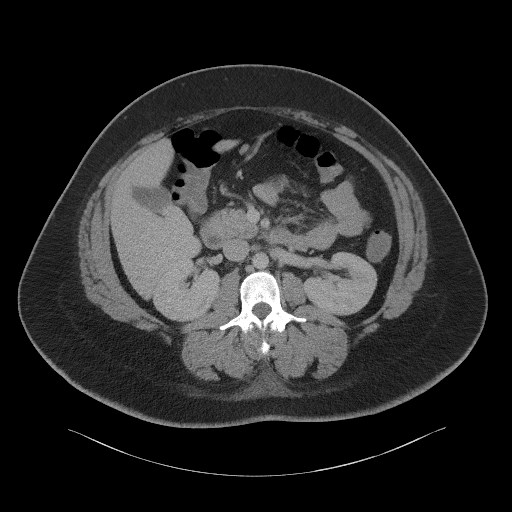
[im 67/100  bone]
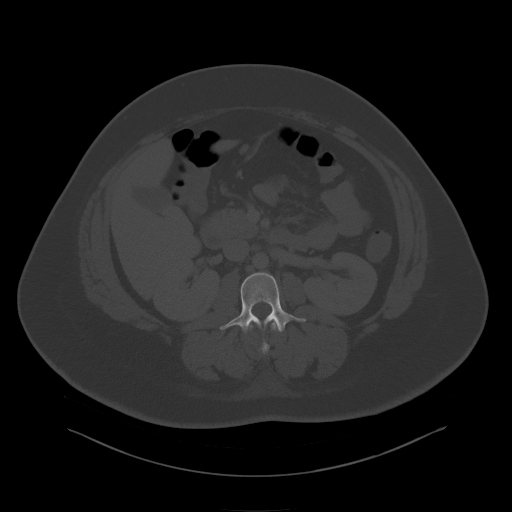
[im 71/100  soft-tissue]
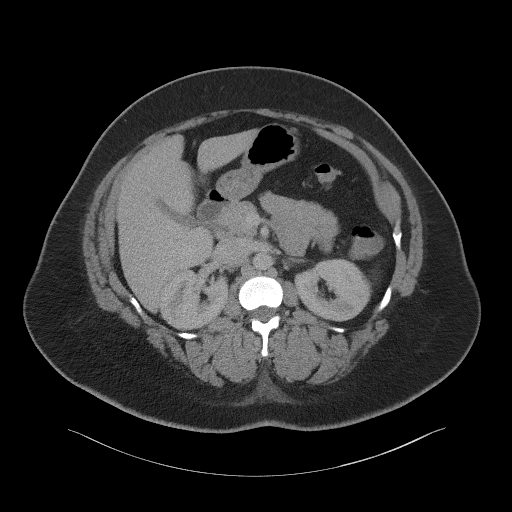
[im 79/100  soft-tissue]
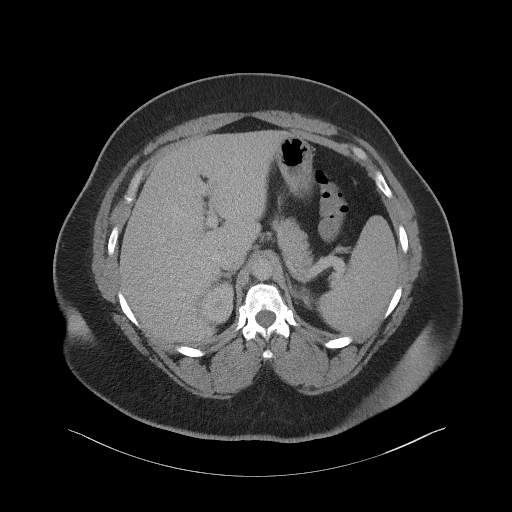
[im 87/100  soft-tissue]
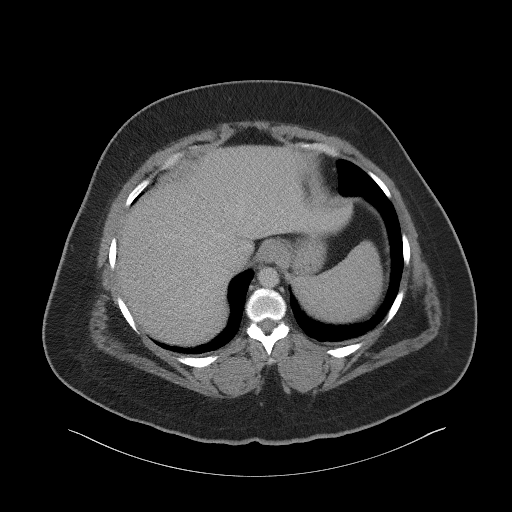
[im 95/100  soft-tissue]
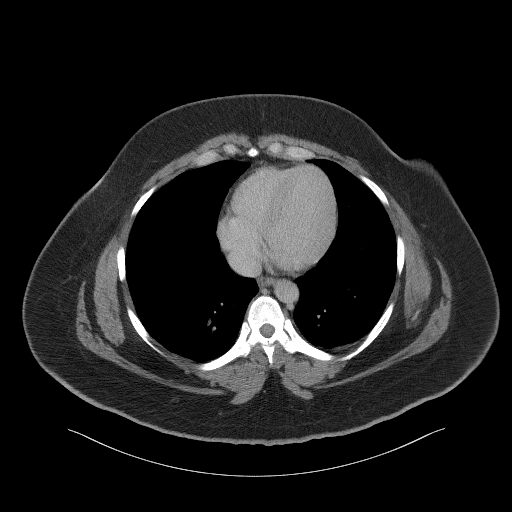

[Series 5: coronal st · coronal · 0.93mm/px · 3 of 123 slices shown]
[im 41/123  soft-tissue]
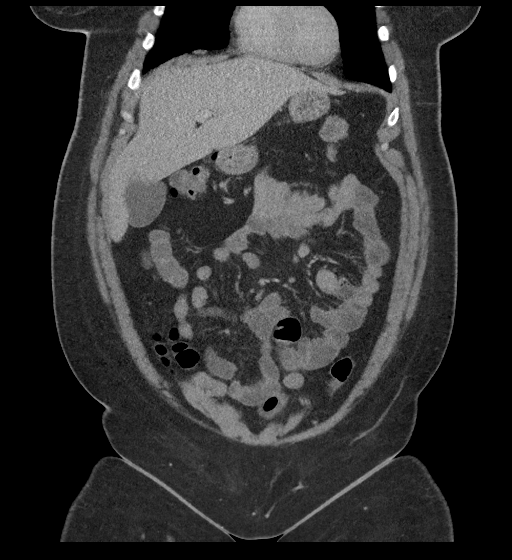
[im 55/123  soft-tissue]
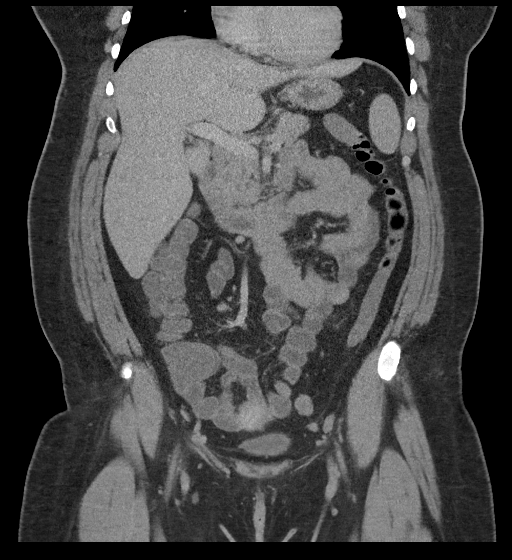
[im 68/123  soft-tissue]
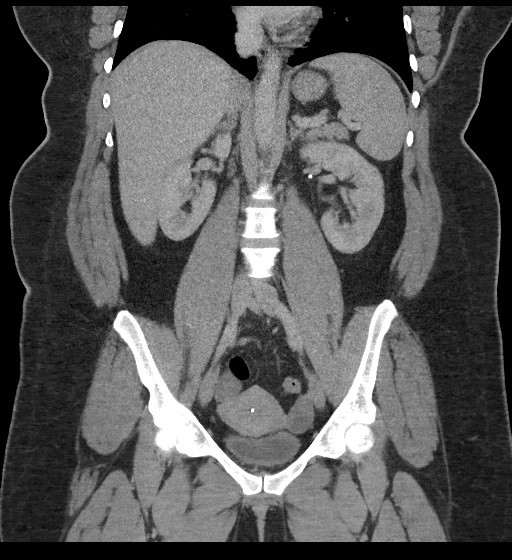

[16 of 46 positions shown; findings below may reference images not displayed]

FINDINGS: Lower chest: No acute abnormality.

Hepatobiliary: No focal liver abnormality is seen. No gallstones,
gallbladder wall thickening, or biliary dilatation.

Pancreas: Unremarkable. No pancreatic ductal dilatation or
surrounding inflammatory changes.

Spleen: Normal in size without focal abnormality.

Adrenals/Urinary Tract: Adrenal glands appear normal. Kidneys are
unremarkable without mass, stone or hydronephrosis. No ureteral or
bladder calculi identified. Bladder is decompressed.

Stomach/Bowel: No dilated large or small bowel loops. No direct
evidence of bowel wall inflammation. Fluid is present throughout a
significant portion of the small bowel which can be a secondary
indication of underlying enteritis. Stomach is unremarkable,
partially decompressed. Appendix is normal.

Vascular/Lymphatic: No significant vascular findings are present. No
enlarged abdominal or pelvic lymph nodes. Mildly prominent lymph
nodes within the central mesentery raising the possibility of a mild
mesenteric adenitis.

Reproductive: Intrauterine device appears appropriately positioned
within the uterus. No suspicious adnexal mass or free fluid.

Other: No free fluid or abscess collection. No free intraperitoneal
air.

Musculoskeletal: No acute or suspicious osseous finding.
IMPRESSION: 1. Fluid throughout a significant portion of the small bowel which
can be a secondary indication of underlying enteritis of infectious
or inflammatory nature.
2. Mildly prominent lymph nodes within the central mesentery raising
the possibility of an associated mild mesenteric adenitis.
3. No bowel obstruction. No free fluid or abscess collection. No
evidence of acute solid organ abnormality. No renal or ureteral
calculi. Appendix is normal.

## 2021-05-21 IMAGING — DX DG CHEST 1V PORT
1 series · 1 of 1 positions shown · non-contrast
Comparison: December 06, 2018

CLINICAL DATA: Shortness of breath

EXAM:
PORTABLE CHEST 1 VIEW

[chest ap]
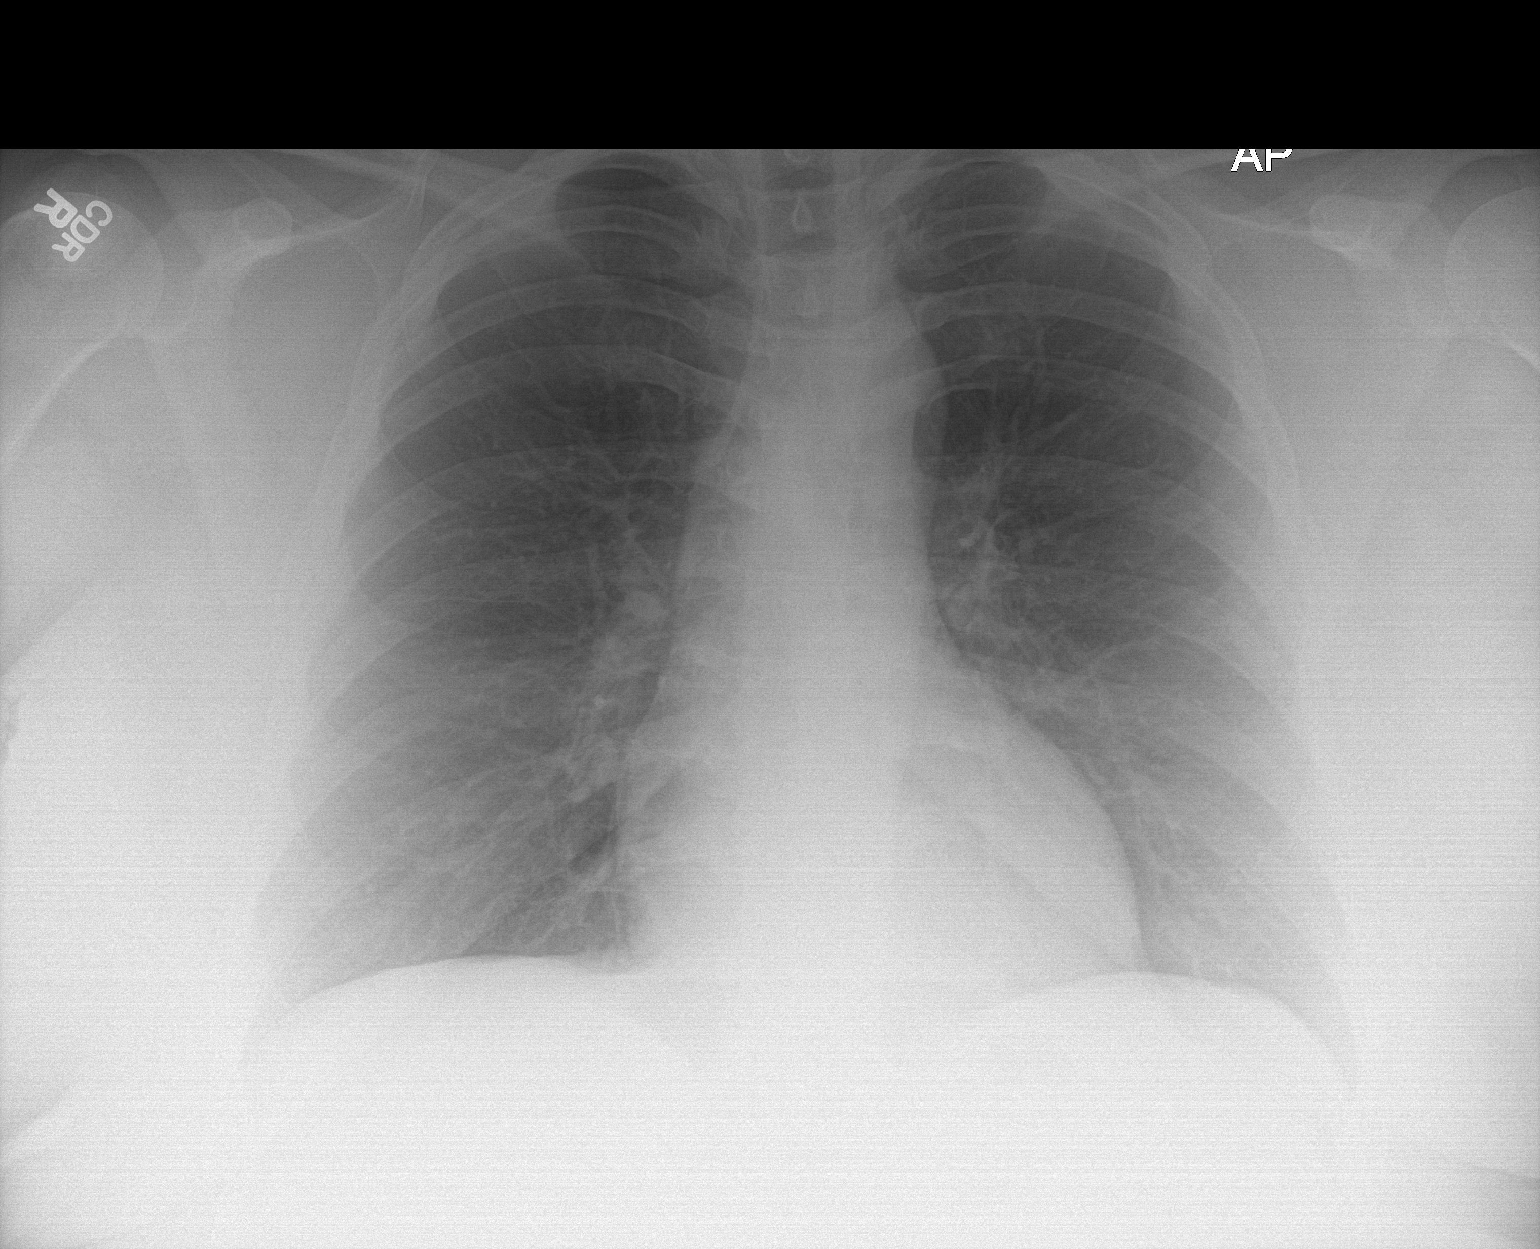

[1 of 1 positions shown; findings below may reference images not displayed]

FINDINGS: The heart size and mediastinal contours are within normal limits.
Both lungs are clear. The visualized skeletal structures are
unremarkable.
IMPRESSION: No active disease.

## 2021-08-15 HISTORY — PX: OTHER SURGICAL HISTORY: SHX169

## 2024-04-10 ENCOUNTER — Other Ambulatory Visit (HOSPITAL_BASED_OUTPATIENT_CLINIC_OR_DEPARTMENT_OTHER): Payer: Self-pay

## 2024-04-10 ENCOUNTER — Encounter (HOSPITAL_BASED_OUTPATIENT_CLINIC_OR_DEPARTMENT_OTHER): Payer: Self-pay | Admitting: *Deleted

## 2024-04-10 ENCOUNTER — Other Ambulatory Visit: Payer: Self-pay

## 2024-04-10 ENCOUNTER — Other Ambulatory Visit (HOSPITAL_COMMUNITY): Payer: Self-pay

## 2024-04-10 ENCOUNTER — Emergency Department (HOSPITAL_BASED_OUTPATIENT_CLINIC_OR_DEPARTMENT_OTHER)
Admission: EM | Admit: 2024-04-10 | Discharge: 2024-04-10 | Disposition: A | Discharge status: Home / Self Care | Attending: Emergency Medicine | Admitting: Emergency Medicine

## 2024-04-10 DIAGNOSIS — R112 Nausea with vomiting, unspecified: Secondary | ICD-10-CM | POA: Diagnosis present

## 2024-04-10 DIAGNOSIS — R109 Unspecified abdominal pain: Secondary | ICD-10-CM | POA: Diagnosis not present

## 2024-04-10 LAB — COMPREHENSIVE METABOLIC PANEL WITH GFR
ALT: 24 U/L (ref 0–44)
AST: 22 U/L (ref 15–41)
Albumin: 4.3 g/dL (ref 3.5–5.0)
Alkaline Phosphatase: 82 U/L (ref 38–126)
Anion gap: 12 (ref 5–15)
BUN: 7 mg/dL (ref 6–20)
CO2: 27 mmol/L (ref 22–32)
Calcium: 9 mg/dL (ref 8.9–10.3)
Chloride: 99 mmol/L (ref 98–111)
Creatinine, Ser: 0.83 mg/dL (ref 0.44–1.00)
GFR, Estimated: >60 mL/min (ref >60)
Glucose, Bld: 104 mg/dL — ABNORMAL HIGH (ref 70–99)
Potassium: 3.8 mmol/L (ref 3.5–5.1)
Sodium: 138 mmol/L (ref 135–145)
Total Bilirubin: 0.5 mg/dL (ref 0.0–1.2)
Total Protein: 7.3 g/dL (ref 6.5–8.1)

## 2024-04-10 LAB — CBC WITH DIFFERENTIAL/PLATELET
Abs Immature Granulocytes: 0.02 K/uL (ref 0.00–0.07)
Basophils Absolute: 0 K/uL (ref 0.0–0.1)
Basophils Relative: 1 %
Eosinophils Absolute: 0.1 K/uL (ref 0.0–0.5)
Eosinophils Relative: 2 %
HCT: 44.5 % (ref 36.0–46.0)
Hemoglobin: 14.6 g/dL (ref 12.0–15.0)
Immature Granulocytes: 0 %
Lymphocytes Relative: 31 %
Lymphs Abs: 2.1 K/uL (ref 0.7–4.0)
MCH: 27.8 pg (ref 26.0–34.0)
MCHC: 32.8 g/dL (ref 30.0–36.0)
MCV: 84.6 fL (ref 80.0–100.0)
Monocytes Absolute: 0.5 K/uL (ref 0.1–1.0)
Monocytes Relative: 7 %
Neutro Abs: 3.9 K/uL (ref 1.7–7.7)
Neutrophils Relative %: 59 %
Platelets: 357 K/uL (ref 150–400)
RBC: 5.26 MIL/uL — ABNORMAL HIGH (ref 3.87–5.11)
RDW: 13.9 % (ref 11.5–15.5)
WBC: 6.6 K/uL (ref 4.0–10.5)
nRBC: 0 % (ref 0.0–0.2)

## 2024-04-10 LAB — URINALYSIS, MICROSCOPIC (REFLEX)

## 2024-04-10 LAB — URINALYSIS, ROUTINE W REFLEX MICROSCOPIC
Glucose, UA: NEGATIVE mg/dL
Ketones, ur: 15 mg/dL — AB
Leukocytes,Ua: NEGATIVE
Nitrite: NEGATIVE
Protein, ur: 100 mg/dL — AB
Specific Gravity, Urine: 1.025 (ref 1.005–1.030)
pH: 6 (ref 5.0–8.0)

## 2024-04-10 LAB — LIPASE, BLOOD: Lipase: 18 U/L (ref 11–51)

## 2024-04-10 LAB — PREGNANCY, URINE: Preg Test, Ur: NEGATIVE

## 2024-04-10 MED ORDER — LACTATED RINGERS IV BOLUS
1000.0000 mL | Freq: Once | INTRAVENOUS | Status: AC
  Administered 2024-04-10: 1000 mL via INTRAVENOUS

## 2024-04-10 MED ORDER — ONDANSETRON HCL 4 MG/2ML IJ SOLN
4.0000 mg | Freq: Once | INTRAMUSCULAR | Status: AC
  Administered 2024-04-10: 4 mg via INTRAVENOUS
  Filled 2024-04-10: qty 2

## 2024-04-10 MED ORDER — ONDANSETRON 4 MG PO TBDP
4.0000 mg | ORAL_TABLET | Freq: Three times a day (TID) | ORAL | 0 refills | Status: AC | PRN
  Filled 2024-04-10: qty 20, 7d supply, fill #0

## 2024-04-10 NOTE — ED Triage Notes (Signed)
 Pt BIB boyfriend. C/o N/V. Onset- yesterday. Take Wegovy. Just increased dosage yesterday also.  Also has headache and feels dehydrated. Took Excedrin H/A. Was unable to keep medication down. Unable to tolerated POs. Denies fever, cough, nasal drainage.

## 2024-04-10 NOTE — ED Provider Notes (Signed)
 [place] EMERGENCY DEPARTMENT AT [facility]  Provider Note  CSN: [id number] Arrival date & time: 04/10/24 0149  History Chief Complaint  Patient presents with   Emesis    Sheri Rodriguez is a 38 y.o. female here for N/V for the last 2 days, recently had dose increase of Wegovy. No diarrhea, no fever. Minimal abdominal pain. No bleeding.    Home Medications Prior to Admission medications   Medication Sig Start Date End Date Taking? Authorizing Provider  ondansetron  (ZOFRAN -ODT) 4 MG disintegrating tablet Take 1 tablet (4 mg total) by mouth every 8 (eight) hours as needed for nausea or vomiting. 04/10/24  Yes Roselyn Carlin NOVAK, MD  albuterol  (VENTOLIN  HFA) 108 [phone] Base) MCG/ACT inhaler Inhale 1-2 puffs into the lungs every 6 (six) hours as needed for wheezing or shortness of breath. 08/03/19   Law, Alexandra M, PA-C     Allergies    Aspirin   Review of Systems   Review of Systems Please see HPI for pertinent positives and negatives  Physical Exam BP (!) 135/100   Pulse 93   Temp 98.1 F (36.7 C) (Oral)   Resp 20   Ht 5' 7 (1.702 m)   Wt (!) 141.5 kg   SpO2 96%   BMI 48.87 kg/m   Physical Exam Vitals and nursing note reviewed.  Constitutional:      Appearance: Normal appearance.  HENT:     Head: Normocephalic and atraumatic.     Nose: Nose normal.     Mouth/Throat:     Mouth: Mucous membranes are dry.  Eyes:     Extraocular Movements: Extraocular movements intact.     Conjunctiva/sclera: Conjunctivae normal.  Cardiovascular:     Rate and Rhythm: Normal rate.  Pulmonary:     Effort: Pulmonary effort is normal.     Breath sounds: Normal breath sounds.  Abdominal:     General: Abdomen is flat.     Palpations: Abdomen is soft.     Tenderness: There is no abdominal tenderness. There is no guarding.  Musculoskeletal:        General: No swelling. Normal range of motion.     Cervical back: Neck supple.  Skin:    General: Skin is warm and dry.   Neurological:     General: No focal deficit present.     Mental Status: She is alert.  Psychiatric:        Mood and Affect: Mood normal.     ED Results / Procedures / Treatments   EKG None  Procedures Procedures  Medications Ordered in the ED Medications  lactated ringers  bolus 1,000 mL (0 mLs Intravenous Stopped 04/10/24 0407)  ondansetron  (ZOFRAN ) injection 4 mg (4 mg Intravenous Given 04/10/24 0224)    Initial Impression and Plan  Patient here for N/V after recent Wegovy increase. Abdomen is benign. Will check labs, give antiemetics and IVF.   ED Course   Clinical Course as of 04/10/24 0427  Wed Apr 10, 2024  0234 CBC is normal.  [CS]  0253 CMP and lipase are normal.  [CS]  0421 UA with few WBC, but no other signs of infection.  [CS]  [id number] Patient sleeping soundly in no distress. Feeling better, no further vomiting. Will plan d/c with Rx for Zofran , advised to discuss Wegovy dosing with PCP. RTED for any other concerns.  [CS]    Clinical Course User Index [CS] Roselyn Carlin NOVAK, MD     MDM Rules/Calculators/A&P Medical Decision Making  Problems Addressed: Nausea and vomiting, unspecified vomiting type: acute illness or injury  Amount and/or Complexity of Data Reviewed Labs: ordered. Decision-making details documented in ED Course.  Risk Prescription drug management.     Final Clinical Impression(s) / ED Diagnoses Final diagnoses:  Nausea and vomiting, unspecified vomiting type    Rx / DC Orders ED Discharge Orders          Ordered    ondansetron  (ZOFRAN -ODT) 4 MG disintegrating tablet  Every 8 hours PRN        04/10/24 0427             Roselyn Carlin NOVAK, MD 04/10/24 [phone]
